# Patient Record
Sex: Male | Born: 1960
Health system: Southern US, Community
[De-identification: ages and names within clinical notes are randomized; demographics above are authoritative.]

## PROBLEM LIST (undated history)

## (undated) DIAGNOSIS — Q453 Other congenital malformations of pancreas and pancreatic duct: Secondary | ICD-10-CM

## (undated) DIAGNOSIS — K219 Gastro-esophageal reflux disease without esophagitis: Secondary | ICD-10-CM

## (undated) DIAGNOSIS — E785 Hyperlipidemia, unspecified: Secondary | ICD-10-CM

## (undated) DIAGNOSIS — F1011 Alcohol abuse, in remission: Secondary | ICD-10-CM

## (undated) DIAGNOSIS — I2510 Atherosclerotic heart disease of native coronary artery without angina pectoris: Secondary | ICD-10-CM

## (undated) DIAGNOSIS — I10 Essential (primary) hypertension: Secondary | ICD-10-CM

## (undated) DIAGNOSIS — Z8616 Personal history of COVID-19: Secondary | ICD-10-CM

## (undated) DIAGNOSIS — K7689 Other specified diseases of liver: Secondary | ICD-10-CM

## (undated) HISTORY — PX: OTHER SURGICAL HISTORY: SHX169

## (undated) HISTORY — DX: Other congenital malformations of pancreas and pancreatic duct: Q45.3

## (undated) HISTORY — PX: SHOULDER ARTHROSCOPY: SHX128

## (undated) HISTORY — DX: Other specified diseases of liver: K76.89

## (undated) HISTORY — DX: Hyperlipidemia, unspecified: E78.5

## (undated) HISTORY — DX: Alcohol abuse, in remission: F10.11

## (undated) HISTORY — DX: Personal history of COVID-19: Z86.16

## (undated) HISTORY — PX: NASAL SINUS SURGERY: SHX719

## (undated) HISTORY — PX: INGUINAL HERNIA REPAIR: SUR1180

---

## 2006-07-20 ENCOUNTER — Encounter: Admission: RE | Admit: 2006-07-20 | Discharge: 2006-07-20 | Payer: Self-pay | Admitting: Gastroenterology

## 2006-07-21 ENCOUNTER — Ambulatory Visit (HOSPITAL_COMMUNITY): Admission: RE | Admit: 2006-07-21 | Discharge: 2006-07-21 | Payer: Self-pay | Admitting: Gastroenterology

## 2006-07-28 ENCOUNTER — Encounter: Admission: RE | Admit: 2006-07-28 | Discharge: 2006-07-28 | Payer: Self-pay | Admitting: Interventional Radiology

## 2006-09-25 ENCOUNTER — Encounter: Admission: RE | Admit: 2006-09-25 | Discharge: 2006-09-25 | Payer: Self-pay | Admitting: General Surgery

## 2006-10-02 HISTORY — PX: COLECTOMY: SHX59

## 2006-10-30 ENCOUNTER — Encounter (INDEPENDENT_AMBULATORY_CARE_PROVIDER_SITE_OTHER): Payer: Self-pay | Admitting: General Surgery

## 2006-10-30 ENCOUNTER — Inpatient Hospital Stay (HOSPITAL_COMMUNITY): Admission: RE | Admit: 2006-10-30 | Discharge: 2006-11-04 | Payer: Self-pay | Admitting: General Surgery

## 2007-06-02 ENCOUNTER — Encounter: Admission: RE | Admit: 2007-06-02 | Discharge: 2007-06-02 | Payer: Self-pay | Admitting: Gastroenterology

## 2007-06-02 IMAGING — CT CT ABDOMEN W/ CM
2 of 5 series · 16 of 46 positions shown, 18 images · IV contrast (READICAT/WATER & [ID] OMNI 300)
Comparison: Barium enema [DATE] and CT abdomen pelvis [DATE]

CT ABDOMEN

CLINICAL DATA: Sigmoid colectomy with pain.

CT ABDOMEN AND PELVIS WITH CONTRAST
TECHNIQUE: Multidetector CT imaging of the abdomen and pelvis was
performed using the standard protocol following bolus
administration of intravenous contrast.
Contrast: 125 ml [16]

[Series 3: routine abdomen · axial · 0.78mm/px · z∈[-477,-82]mm · 13 of 89 slices shown, 15 images]
[im 5/89  soft-tissue]
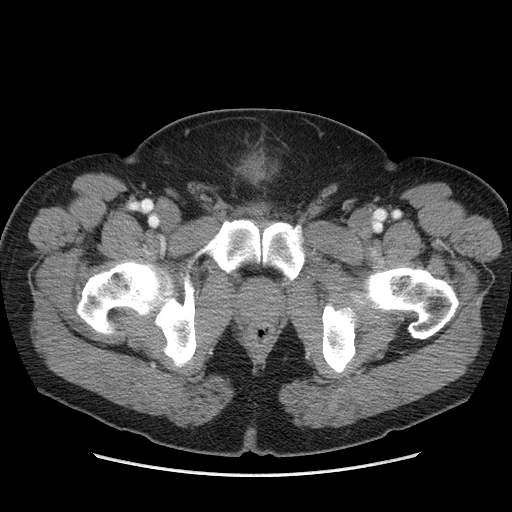
[im 5/89  bone]
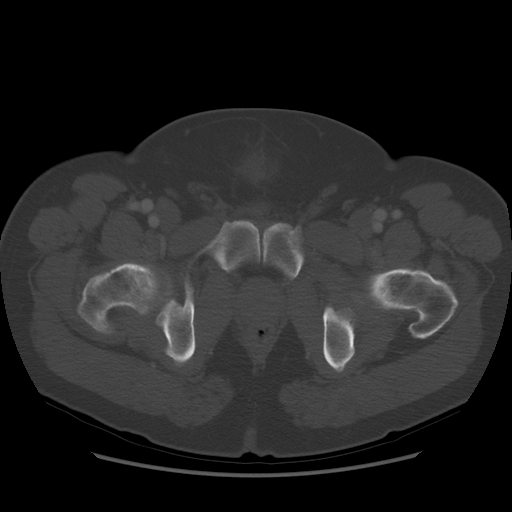
[im 10/89  soft-tissue]
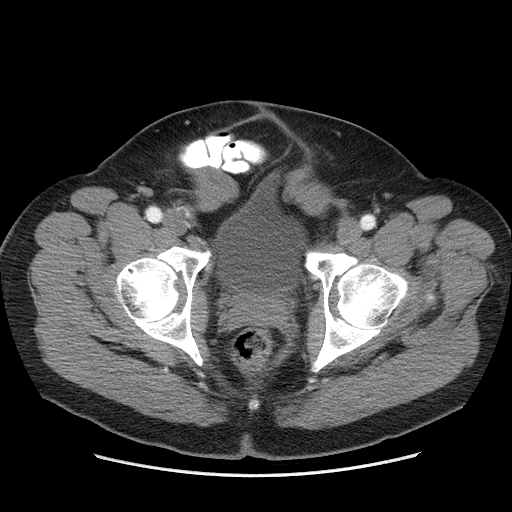
[im 20/89  soft-tissue]
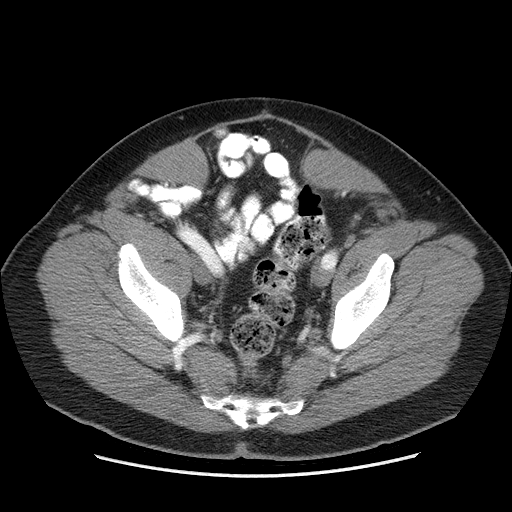
[im 25/89  soft-tissue]
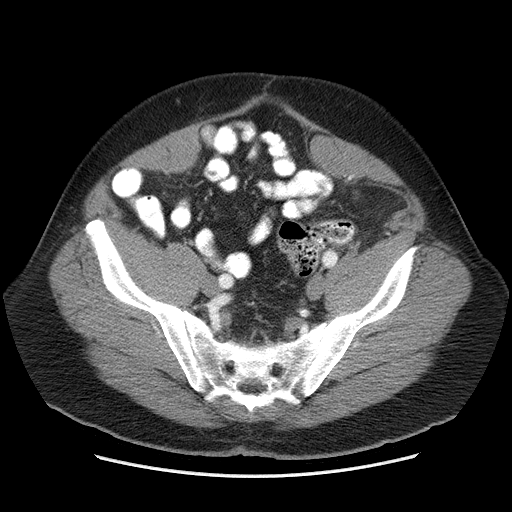
[im 30/89  soft-tissue]
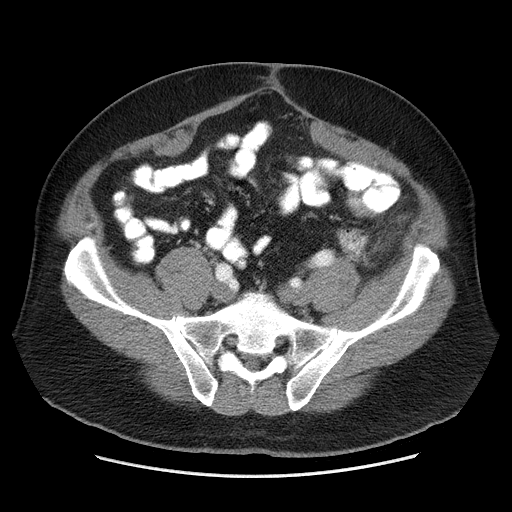
[im 40/89  soft-tissue]
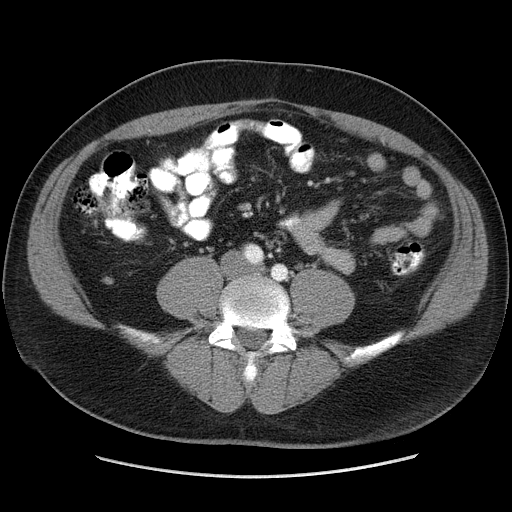
[im 45/89  soft-tissue]
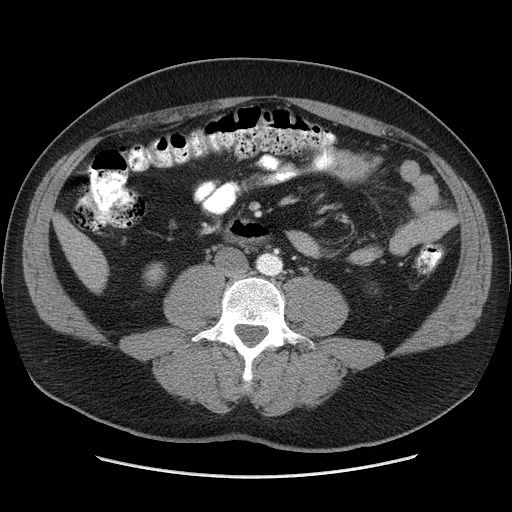
[im 49/89  soft-tissue]
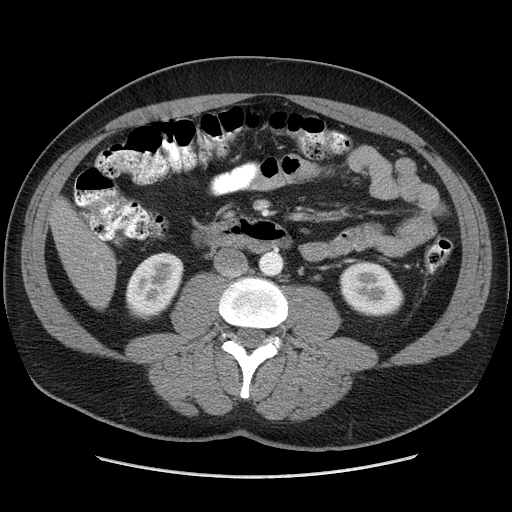
[im 59/89  soft-tissue]
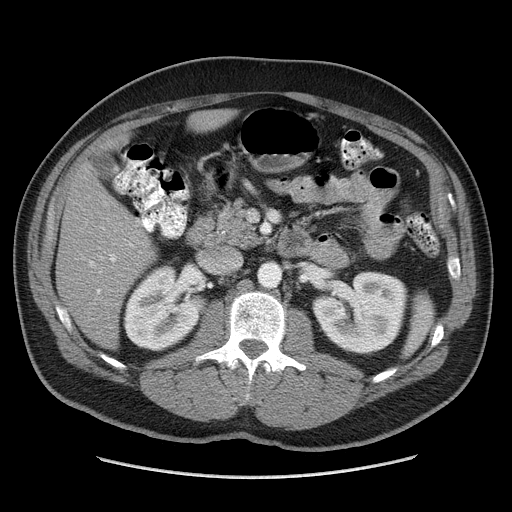
[im 59/89  bone]
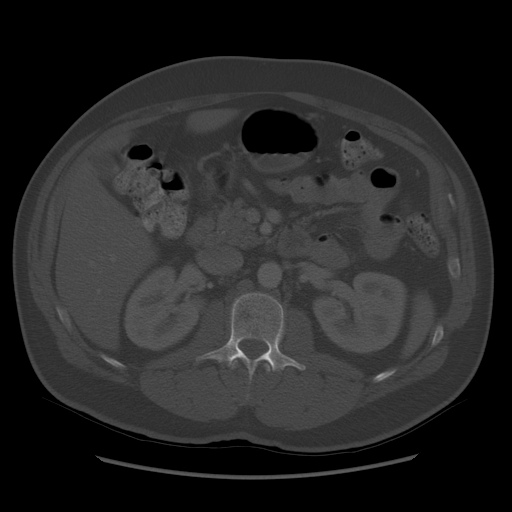
[im 64/89  soft-tissue]
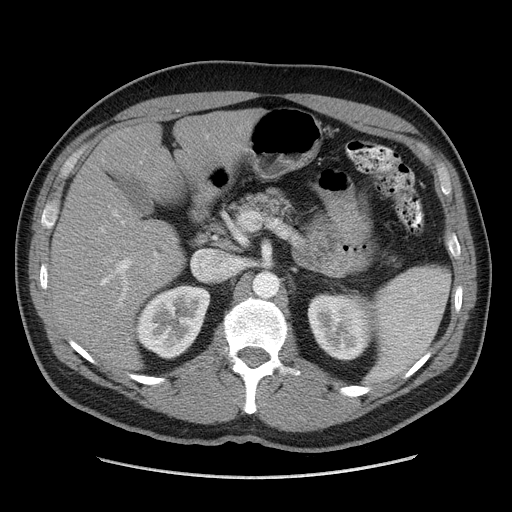
[im 69/89  soft-tissue]
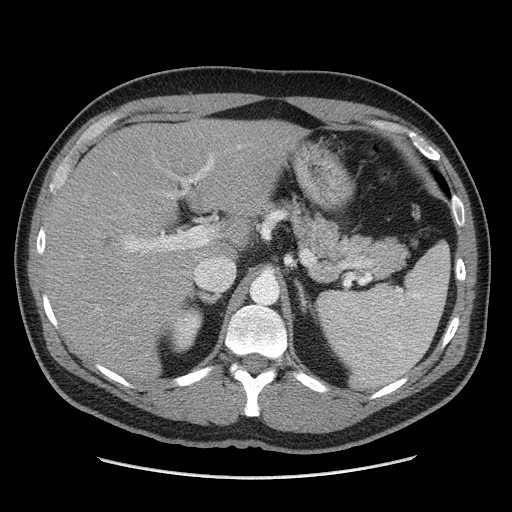
[im 79/89  soft-tissue]
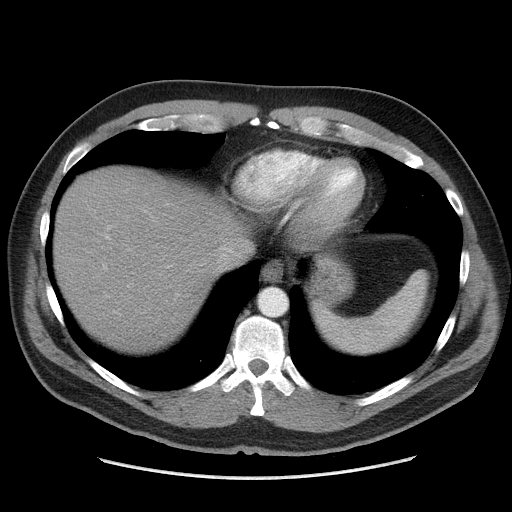
[im 84/89  soft-tissue]
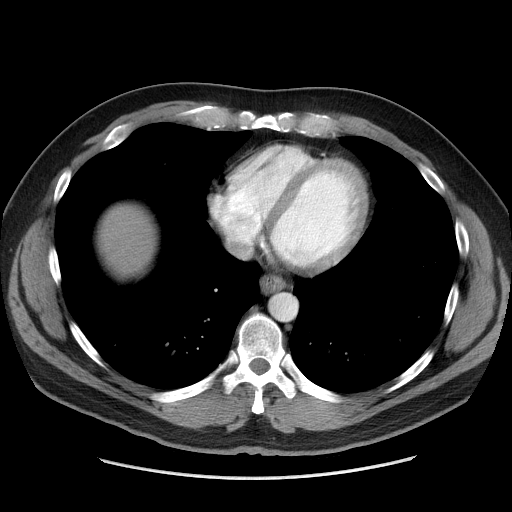

[Series 602: sagittal body · sagittal · 0.91mm/px · 3 of 161 slices shown]
[im 54/161  soft-tissue]
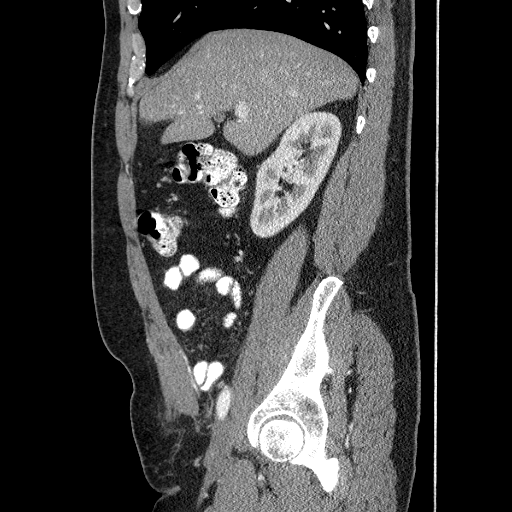
[im 72/161  soft-tissue]
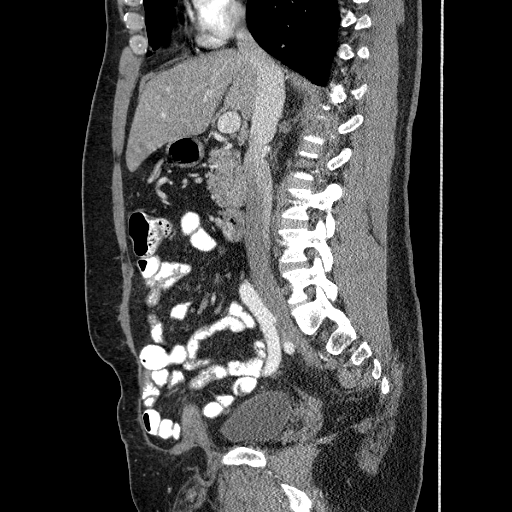
[im 89/161  soft-tissue]
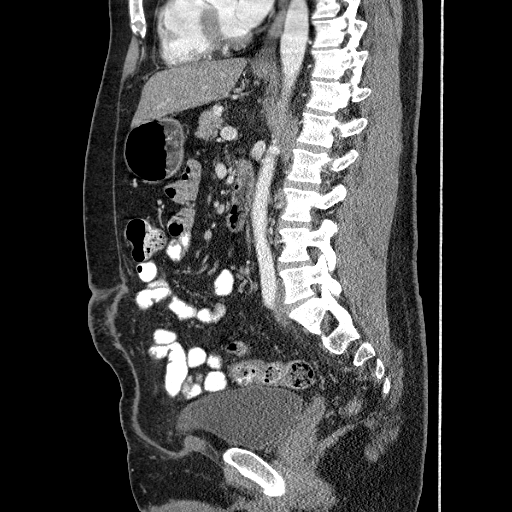

[16 of 46 positions shown; findings below may reference images not displayed]

FINDINGS: The visualized lung bases are clear.

There is mild diffuse fatty infiltration of the liver.  A
circumscribed 1 cm low density lesion in the caudate lobe is
stable, most likely a simple cyst.  Likewise, two 6 mm low density
lesions in the right lobe the liver are stable, and most likely
cysts.

The spleen, adrenal glands, pancreas, gallbladder, and kidneys
within normal limits.  On delayed imaging, both kidneys secrete
contrast symmetrically, without evidence of obstruction.  The
abdominal aorta is normal in caliber.

Bowel loops are normal in caliber.  Negative for lymphadenopathy.

In the left lower quadrant, there is stranding in the mesentery
adjacent to a short segment of the descending colon, that likely
represents inflammatory changes of acute diverticulitis.  These
changes are best seen on images numbers 55 through 61 of series 3.
There is no evidence of colonic wall thickening or obstruction.  No
abscess or free air is identified.
IMPRESSION: 1.  Stranding in the mesentery adjacent to the distal descending
colon as described above most likely represents acute
diverticulitis. No complicating features are identified.

2. Fatty liver.

3. Three stable low density lesions in the liver are most likely
cysts.

CT PELVIS
FINDINGS: There are postoperative changes of sigmoid partial
sigmoid colectomy, with an anastomotic suture identified.  There is
no evidence of bowel obstruction.  There is an infraumbilical lower
anterior wall abdominal hernia, which is new.  The rectus muscles
are separated and this hernia has a wide-mouth, measuring
approximately 7 cm transverse diameter.  Several small bowel loops
protrude into this hernia but do not appear obstructed or inflamed.
A small portion of the anterior bladder also extends into this
hernia.  Otherwise, the urinary bladder is unremarkable.  No free
fluid or abscess is identified in the pelvis. The appendix is
normal.

No acute osseous abnormalities identified.
IMPRESSION: 1.  Interval development of lower anterior abdominal wall ventral
hernia, which contains nonobstructed small bowel loops and a small
portion of the anterior aspect of the urinary bladder.

## 2007-08-02 HISTORY — PX: HERNIA REPAIR: SHX51

## 2007-08-06 ENCOUNTER — Ambulatory Visit (HOSPITAL_COMMUNITY): Admission: RE | Admit: 2007-08-06 | Discharge: 2007-08-06 | Payer: Self-pay | Admitting: General Surgery

## 2007-08-31 ENCOUNTER — Inpatient Hospital Stay (HOSPITAL_COMMUNITY): Admission: RE | Admit: 2007-08-31 | Discharge: 2007-09-02 | Payer: Self-pay | Admitting: General Surgery

## 2010-07-16 NOTE — Op Note (Signed)
Darrell Coleman, Darrell Coleman                ACCOUNT NO.:  [id number]   MEDICAL RECORD NO.:  [id number]          PATIENT TYPE:  OIB   LOCATION:  5125                         FACILITY:  [facility]   PHYSICIAN:  Adolph Pollack, M.D.DATE OF BIRTH:  [birth year]   DATE OF PROCEDURE:  DATE OF DISCHARGE:                               OPERATIVE REPORT   PREOPERATIVE DIAGNOSIS:  Ventral incisional hernia.   POSTOPERATIVE DIAGNOSIS:  Ventral incisional hernia.   PROCEDURE:  Laparoscopic repair of ventral incisional hernia with  Parietex composite mesh (20 x 30 cm).   SURGEON:  Adolph Pollack, MD.   ASSISTANTAngelia Mould. Derrell Lolling, MD.   ANESTHESIA:  General.   INDICATIONS:  This is a 47-year male with a laparoscopic-assisted  sigmoid colectomy in late 2008.  He subsequently developed a ventral  incisional hernia and now presents for repair.   TECHNIQUE:  He was seen in the holding area and then brought to the  operating room, placed on the operating table and general anesthetic was  administered.  A Foley catheter was placed in the bladder.  The hair on  the abdominal wall was shaved and the abdominal wall groin area were  sterilely prepped and draped.  A small incision was made in the right  upper quadrant and using a 5 mm Optiview trocar, I was able to gain  access to the peritoneal cavity.  I carefully examined the area beneath  the trocar and no injury to the viscera was noted.  CO2 gas was  insufflated creating a pneumoperitoneum.  I visualized the hernia defect  noted some omentum up into the hernia.  I subsequently placed a 5 mm  trocar in the right mid lateral abdomen and one in the left upper  quadrant and one in the left mid and lateral abdomen.  Using Harmonic  Scalpel, I dissected the omentum free from the hernia, exposing that  area.  In order for it to be indicated an inferior fixation, I felt I  was going to need to expose the symphysis pubis and Cooper ligament.  Thus, I began  creating a peritoneal flap close to muscle by dividing the  peritoneum and mobilizing it posteriorly.  I subsequently was mobilizing  the bladder more posteriorly as well.  I then was able to expose the  symphysis pubis and Cooper ligament bilaterally.   Following this mobilization, I felt the bladder up of 350 mL of blue  solution and noticed no leaks.  I then decompressed the bladder.   I then used a spinal needle to identify the rim of the hernia and then  measured 4 cm back from this.  Following this, a piece of 20 cm x 30 cm  Parietex composite mesh was brought into the field with a nonadherent  barrier on it.  I placed a 7 anchoring sutures of #1  Novafil on the  mesh and then hydrated it.  It was then placed into abdominal cavity and  positioned, so that the nonadherent barrier side was facing the viscera.  I then made 7 stab incisions, beginning at 12  o'clock and then going at  approximately 1:30, 3 o'clock, 5:30, 7:30, 10 o'clock.  I then threaded  the anchoring sutures up across the fascial bridge and tied these down,  initially anchoring the mesh to the abdominal wall with good overlap  covering the defect.  Then, the inferior portion of the mesh, I  approached and I laid against Cooper ligament and tacked it with  multiple tacks to Why ligament so that there were no gaps.  Following  this, I then packed the rest of the mesh in the periphery with both an  outer ring and an inner ring using spiral tacks that allowed for good  fixation.   I inspected the area, inspected all quadrants, noted no bleeding, and no  visceral injury.  The mesh was in good position with good overlap.   In order to place the mesh, I had to replace the right upper quadrant 5  mm trocar with 12 mm trocar.  Thus I removed this trocar and then closed  the fascial defect with a 0 Vicryl suture.  I then removed the remaining  trocars and released pneumoperitoneum.   All skin incisions were then closed  with 4-0 Monocryl subcuticular  stitches followed by Steri-Strips, Sterile dressings.  He tolerated  procedure without apparent complications and was taken to recovery in  satisfactory condition.      Adolph Pollack, M.D.  Electronically Signed     TJR/MEDQ  D:  08/31/2007  T:  09/01/2007  Job:  [id number]   cc:   Danise Edge, M.D.  Vikki Ports, M.D.

## 2010-07-16 NOTE — Op Note (Signed)
NAMEMERRIL, NAGY                ACCOUNT NO.:  [id number]   MEDICAL RECORD NO.:  [id number]          PATIENT TYPE:  INP   LOCATION:  1536                         FACILITY:  [facility]   PHYSICIAN:  Adolph Pollack, M.D.DATE OF BIRTH:  [birth year]   DATE OF PROCEDURE:  10/30/2006  DATE OF DISCHARGE:                               OPERATIVE REPORT   PREOPERATIVE DIAGNOSIS:  Recurrent sigmoid diverticulitis.   POSTOPERATIVE DIAGNOSIS:  Recurrent sigmoid diverticulitis.   PROCEDURE:  Laparoscopic assisted sigmoid colectomy with mobilization of  splenic flexure.   SURGEON:  Adolph Pollack, M.D.   ASSISTANT:  Leonie Man, M.D.   ANESTHESIA:  General.   INDICATIONS:  This is a 50 year old male who has had two separate  episodes of diverticulitis.  The last one in May was complicated by an  abscess that required percutaneous drainage.  A barium enema  demonstrates a sigmoid diverticulosis but no other lesions.  He now  presents for elective sigmoid colectomy.   TECHNIQUE:  He is brought to the operating room, placed supine on the  operating table, and a general anesthetic was administered.  A Foley  catheter was placed in the bladder and he was placed in the lithotomy  position.  The hair of the abdominal wall was clipped.  The abdominal  wall and perineal area were sterilely prepped and draped.  A transverse  incision was made superior to the umbilicus through the skin and  subcutaneous tissue, then in the fascia and peritoneum entering the  peritoneal cavity.  A pursestring suture of 0 Vicryl was placed around  the fascial edges.  A Hassan trocar was introduced into the peritoneal  cavity and pneumoperitoneum created by insufflation of CO2 gas.   A laparoscope was then introduced.  An 11 mm trocar was placed in the  right lower quadrant, a 5 mm trocar placed in the suprapubic region, and  a 5 mm trocar placed in the left lower quadrant.  By introducing the  laparoscope, I  could see inflammatory changes and a track from the  catheter if fiber tissue at the anterior abdominal wall which I took all  this down sharply.  I then divided the lateral attachments to the  sigmoid and descending colon heading up toward the splenic flexure.  The  Omega maneuver was performed and I mobilized the splenic flexure using  select electrocautery and sharp dissection.  I then mobilized the  rectosigmoid junction bilaterally.  The left ureter was identified and  the plane of dissection kept anterior to it.  This allowed fairly good  mobilization.  At this point, I removed the suprapubic trocar and made a  longitudinal lower midline incision for an extraction site.  He had a  very thick abdominal wall and I had to extend the excision up few cm  inferior to the umbilicus.  A Balfour retractor was used.  I further  mobilized the rectal area.  I then noticed the descending colon which  was normal as well as the rectosigmoid junction.  I divided the  descending colon with the stapler.  The mesentery  of the sigmoid colon  was divided with the LigaSure close to the sigmoid colon down to the  rectosigmoid junction.  This was also then divided with the linear  cutting stapler.  The specimen's distal margin was marked with a suture  and it was sent to pathology.   Following this, I further mobilized some of the rectum and the  descending colon.  I then partially removed the staple line and  descending colon and inserted a #29 EEA anvil then resealed the staple  line with a linear cutting stapler.  I brought the anvil tip out of the  tinea in a Baker type fashion.  The handle of a #29 EEA was then passed  up through the rectum and into the rectosigmoid junction.  An end-to-  side Baker anastomosis was performed with the circular stapler.  The  stapler was removed and two intact donuts were noted.  I occluded the  descending colon proximal to the anastomosis and placed the anastomosis   under water.  Air was insufflated and no leak was noted.  I reinforced  some of the lateral staple line with stitches.  There was no significant  tension on the anastomosis and it was patent and viable.   At this point, we copiously irrigated out the abdominal cavity and noted  no active bleeding.  I requested a sponge and instrument count and this  was reported to be correct.  I then evacuated as much irrigation fluid  as possible.  I closed the fascia of the lower midline extraction  incision with a #1 running PDS suture.  The subcutaneous tissue was  irrigated.  I reinsufflated the abdomen and reintroduced the  laparoscope.  A little bit of irrigation fluid remains which I  evacuated.  The fascial closure was solid.  There was no active  bleeding.  The trocars were removed and pneumoperitoneum was released.  All skin incisions were then closed with staples and sterile dressings  were applied.  He tolerated the procedure well without any apparent  complications and was taken to the recovery room in satisfactory  condition.      Adolph Pollack, M.D.  Electronically Signed     TJR/MEDQ  D:  10/30/2006  T:  10/31/2006  Job:  [id number]   cc:   Danise Edge, M.D.  Fax: [phone]   Vikki Ports, M.D.  Fax: [phone]

## 2010-07-16 NOTE — H&P (Signed)
NAMEASAF, ELMQUIST                ACCOUNT NO.:  [id number]   MEDICAL RECORD NO.:  [id number]          PATIENT TYPE:  OIB   LOCATION:  5125                         FACILITY:  [facility]   PHYSICIAN:  Adolph Pollack, M.D.DATE OF BIRTH:  1960/03/26   DATE OF ADMISSION:  08/31/2007  DATE OF DISCHARGE:                              HISTORY & PHYSICAL   REASON FOR ADMISSION:  Elective ventral incisional hernia repair.   HISTORY OF PRESENT ILLNESS:  Mr. Pianka is a 50 year old male who  underwent a laparoscopic-assisted sigmoid colectomy late summer of 2008.  He had a significant vomiting, retching episode in December.  I saw him  in December.  I did note he had some asymmetry to his incision, but I  could demonstrate clinically an obvious hernia, but I told him there was  a small chance that could be and if it became more evident, to come  back.  He had little bit of discomfort in left lower quadrant area and a  CT scan was performed in early April which demonstrated a lower  abdominal wall hernia.  He has noticed it increasingly now and was seen  in the office and now presents for repair.  His first operation was  postponed because he developed a rash which ends up to being shingles,  but this has cleared up and his medical doctor has cleared him with  respect to the rash.   PAST MEDICAL HISTORY:  1. Sigmoid diverticulitis.  2. Hypertension.  3. Nasal polyps.  4. Shingles.  5. Femur fracture.   PREVIOUS OPERATIONS:  1. Laparoscopic-assisted sigmoid colectomy.  2. Shoulder arthroscopy.  3. Nasal polypectomy.  4. Bilateral right inguinal hernia repair.  5. Left hydrocelectomy.   ALLERGIES:  PENICILLIN.   CURRENT MEDICATIONS:  Lisinopril, lysine supplement, fish oil, Tylenol.   SOCIAL HISTORY:  He is married.  There is no tobacco use.  Occasional  alcohol use.   PHYSICAL EXAMINATION:  GENERAL:  A mildly overweight male in no acute  distress, pleasant and cooperative.  VITAL  SIGNS: Temperature is 97 degrees, blood pressure is 101/62, pulse  66.  RESPIRATORY:  Breath sounds equal and clear.  Respirations unlabored.  CARDIOVASCULAR:  Regular rate, regular rhythm.  No murmur.  ABDOMEN:  Soft.  There is a lower midline scar, smaller upper midline  scars.  There is a bulge going down to the pubic bone that is reducible.  MUSCULOSKELETAL:  Good range of motion, muscle tone.  No swelling.   IMPRESSION:  Ventral incisional hernia.   PLAN:  Laparoscopic possible open repair with mesh.  We discussed the  procedure and risks preop.      Adolph Pollack, M.D.  Electronically Signed     TJR/MEDQ  D:  08/31/2007  T:  09/01/2007  Job:  [id number]

## 2010-07-19 NOTE — Discharge Summary (Signed)
Darrell Coleman, Darrell Coleman                ACCOUNT NO.:  [id number]   MEDICAL RECORD NO.:  [id number]          PATIENT TYPE:  INP   LOCATION:  1536                         FACILITY:  [facility]   PHYSICIAN:  Adolph Pollack, M.D.DATE OF BIRTH:  04-Apr-1960   DATE OF ADMISSION:  10/30/2006  DATE OF DISCHARGE:  11/04/2006                               DISCHARGE SUMMARY   FINAL DIAGNOSIS:  Recurrent sigmoid diverticulitis.   SECONDARY DIAGNOSIS:  Hypertension.   OPERATION/PROCEDURE:  1. Laparoscopic-assisted sigmoid colectomy.  2. Mobilization of splenic flexure, October 30, 2006.   REASON FOR ADMISSION:  This is a 50 year old male who has had recurring  episodes of sigmoid diverticulitis.  One required percutaneous drainage  of a diverticular abscess.  He is now admitted for elective sigmoid  colectomy.   HOSPITAL COURSE:  He underwent the above procedure and tolerated it  well.  He had basically an uncomplicated postoperative course and had  return of bowel function, was started on diet which was advanced.  By  his fifth postoperative day, he was tolerating full liquids and then he  tolerated a soft diet, bowels were working.  He was passing gas and had  small BMs and he was able to be discharged.   DISPOSITION:  Discharged to home on November 04, 2003.  The staples were  removed and Steri-Strips were applied very close together.  He was given  discharge instructions and Tylox for pain and told to continue his home  medications.  He will follow up with me in the office in approximately 2  to 3 weeks.      Adolph Pollack, M.D.  Electronically Signed     TJR/MEDQ  D:  11/17/2006  T:  11/17/2006  Job:  [id number]   cc:   Danise Edge, M.D.  Fax: [phone]   Vikki Ports, M.D.  Fax: [phone]

## 2010-11-28 LAB — DIFFERENTIAL
Basophils Relative: 1
Basophils Relative: 1
Eosinophils Absolute: 0.8 — ABNORMAL HIGH
Eosinophils Relative: 15 — ABNORMAL HIGH
Lymphocytes Relative: 35
Lymphs Abs: 1.3
Monocytes Absolute: 0.6
Monocytes Absolute: 0.6
Monocytes Relative: 10
Monocytes Relative: 10
Neutro Abs: 2.7
Neutrophils Relative %: 51
Neutrophils Relative %: 45

## 2010-11-28 LAB — CBC
HCT: 40.5
Hemoglobin: 15.4
Hemoglobin: 14.4
MCHC: 35.6
MCV: 100.3 — ABNORMAL HIGH
Platelets: 204
RBC: 4.47
RDW: 12.2
RDW: 13.8

## 2010-11-28 LAB — COMPREHENSIVE METABOLIC PANEL
ALT: 49
AST: 29
Albumin: 4.6
Alkaline Phosphatase: 36 — ABNORMAL LOW
Alkaline Phosphatase: 33 — ABNORMAL LOW
BUN: 8
Calcium: 10
Calcium: 9.6
Creatinine, Ser: 1.06
GFR calc Af Amer: >60
Glucose, Bld: 112 — ABNORMAL HIGH
Glucose, Bld: 92
Potassium: 3.7
Sodium: 139
Total Protein: 7.1
Total Protein: 7.5

## 2010-11-28 LAB — TYPE AND SCREEN
ABO/RH(D): B POS
Antibody Screen: NEGATIVE

## 2010-11-28 LAB — PROTIME-INR
INR: 1
INR: 1

## 2010-11-28 LAB — ABO/RH: ABO/RH(D): B POS

## 2010-12-13 LAB — CBC
HCT: 40.8
MCHC: 35.9
MCV: 98
MCV: 98.6
Platelets: 148 — ABNORMAL LOW
Platelets: 185
RBC: 3.36 — ABNORMAL LOW
RDW: 12.9
WBC: 5.4
WBC: 6.7

## 2010-12-13 LAB — COMPREHENSIVE METABOLIC PANEL
AST: 30
Albumin: 3.7
BUN: 7
Creatinine, Ser: 0.9
GFR calc Af Amer: >60
Total Protein: 7.1

## 2010-12-13 LAB — BASIC METABOLIC PANEL
BUN: 4 — ABNORMAL LOW
CO2: 25
Calcium: 8.3 — ABNORMAL LOW
Chloride: 105
Creatinine, Ser: 1
GFR calc Af Amer: >60

## 2010-12-13 LAB — DIFFERENTIAL
Eosinophils Relative: 11 — ABNORMAL HIGH
Lymphocytes Relative: 23
Lymphs Abs: 1.2
Monocytes Absolute: 0.5
Monocytes Relative: 9
Neutro Abs: 3

## 2010-12-13 LAB — TYPE AND SCREEN
ABO/RH(D): B POS
Antibody Screen: NEGATIVE

## 2010-12-13 LAB — ABO/RH: ABO/RH(D): B POS

## 2011-06-02 ENCOUNTER — Inpatient Hospital Stay (HOSPITAL_COMMUNITY)
Admission: EM | Admit: 2011-06-02 | Discharge: 2011-06-03 | DRG: 125 | Disposition: A | Discharge status: Home / Self Care | Payer: BC Managed Care – PPO | Source: Ambulatory Visit | Attending: Cardiology | Admitting: Cardiology

## 2011-06-02 ENCOUNTER — Emergency Department (HOSPITAL_COMMUNITY): Payer: BC Managed Care – PPO

## 2011-06-02 ENCOUNTER — Other Ambulatory Visit: Payer: Self-pay

## 2011-06-02 ENCOUNTER — Encounter (HOSPITAL_COMMUNITY): Payer: Self-pay | Admitting: *Deleted

## 2011-06-02 ENCOUNTER — Encounter (HOSPITAL_COMMUNITY)
Admission: EM | Disposition: A | Discharge status: Home / Self Care | Payer: Self-pay | Source: Ambulatory Visit | Attending: Cardiology

## 2011-06-02 DIAGNOSIS — R0789 Other chest pain: Principal | ICD-10-CM | POA: Diagnosis present

## 2011-06-02 DIAGNOSIS — R072 Precordial pain: Secondary | ICD-10-CM

## 2011-06-02 DIAGNOSIS — Z880 Allergy status to penicillin: Secondary | ICD-10-CM

## 2011-06-02 DIAGNOSIS — R079 Chest pain, unspecified: Secondary | ICD-10-CM

## 2011-06-02 DIAGNOSIS — I10 Essential (primary) hypertension: Secondary | ICD-10-CM | POA: Diagnosis present

## 2011-06-02 DIAGNOSIS — Z79899 Other long term (current) drug therapy: Secondary | ICD-10-CM

## 2011-06-02 DIAGNOSIS — I2510 Atherosclerotic heart disease of native coronary artery without angina pectoris: Secondary | ICD-10-CM

## 2011-06-02 DIAGNOSIS — Z7982 Long term (current) use of aspirin: Secondary | ICD-10-CM

## 2011-06-02 DIAGNOSIS — E785 Hyperlipidemia, unspecified: Secondary | ICD-10-CM

## 2011-06-02 DIAGNOSIS — Z87891 Personal history of nicotine dependence: Secondary | ICD-10-CM

## 2011-06-02 HISTORY — PX: LEFT HEART CATHETERIZATION WITH CORONARY ANGIOGRAM: SHX5451

## 2011-06-02 HISTORY — DX: Gastro-esophageal reflux disease without esophagitis: K21.9

## 2011-06-02 HISTORY — DX: Essential (primary) hypertension: I10

## 2011-06-02 HISTORY — DX: Atherosclerotic heart disease of native coronary artery without angina pectoris: I25.10

## 2011-06-02 HISTORY — PX: CARDIAC CATHETERIZATION: SHX172

## 2011-06-02 LAB — DIFFERENTIAL
Basophils Absolute: 0 10*3/uL (ref 0.0–0.1)
Lymphocytes Relative: 20 % (ref 12–46)
Lymphs Abs: 1.2 10*3/uL (ref 0.7–4.0)
Neutro Abs: 3.3 10*3/uL (ref 1.7–7.7)

## 2011-06-02 LAB — BASIC METABOLIC PANEL
CO2: 26 mEq/L (ref 19–32)
Chloride: 102 mEq/L (ref 96–112)
Glucose, Bld: 113 mg/dL — ABNORMAL HIGH (ref 70–99)
Potassium: 3.5 mEq/L (ref 3.5–5.1)
Sodium: 137 mEq/L (ref 135–145)

## 2011-06-02 LAB — CARDIAC PANEL(CRET KIN+CKTOT+MB+TROPI)
CK, MB: 1.2 ng/mL (ref 0.3–4.0)
CK, MB: 1.2 ng/mL (ref 0.3–4.0)
Relative Index: INVALID (ref 0.0–2.5)
Total CK: 64 U/L (ref 7–232)
Troponin I: <0.3 ng/mL (ref <0.30)

## 2011-06-02 LAB — CBC
HCT: 36.5 % — ABNORMAL LOW (ref 39.0–52.0)
Hemoglobin: 13 g/dL (ref 13.0–17.0)
MCV: 91 fL (ref 78.0–100.0)
Platelets: 208 10*3/uL (ref 150–400)
RBC: 4.33 MIL/uL (ref 4.22–5.81)
RBC: 3.99 MIL/uL — ABNORMAL LOW (ref 4.22–5.81)
RDW: 12.5 % (ref 11.5–15.5)
RDW: 12.7 % (ref 11.5–15.5)
WBC: 5.9 10*3/uL (ref 4.0–10.5)
WBC: 6 10*3/uL (ref 4.0–10.5)

## 2011-06-02 LAB — CREATININE, SERUM
GFR calc Af Amer: >90 mL/min (ref >90)
GFR calc non Af Amer: >90 mL/min (ref >90)

## 2011-06-02 LAB — TSH: TSH: 2.27 u[IU]/mL (ref 0.350–4.500)

## 2011-06-02 LAB — D-DIMER, QUANTITATIVE: D-Dimer, Quant: 0.32 ug/mL-FEU (ref 0.00–0.48)

## 2011-06-02 LAB — TROPONIN I: Troponin I: <0.3 ng/mL (ref <0.30)

## 2011-06-02 SURGERY — LEFT HEART CATHETERIZATION WITH CORONARY ANGIOGRAM
Anesthesia: LOCAL

## 2011-06-02 MED ORDER — ASPIRIN 81 MG PO CHEW
324.0000 mg | CHEWABLE_TABLET | Freq: Once | ORAL | Status: AC
  Administered 2011-06-02: 324 mg via ORAL
  Filled 2011-06-02: qty 4

## 2011-06-02 MED ORDER — NITROGLYCERIN 0.4 MG SL SUBL
0.4000 mg | SUBLINGUAL_TABLET | SUBLINGUAL | Status: DC | PRN
  Administered 2011-06-02: 0.4 mg via SUBLINGUAL
  Filled 2011-06-02: qty 25

## 2011-06-02 MED ORDER — SODIUM CHLORIDE 0.9 % IV SOLN: INTRAVENOUS | Status: AC

## 2011-06-02 MED ORDER — NITROGLYCERIN 0.4 MG SL SUBL: 0.4000 mg | SUBLINGUAL_TABLET | SUBLINGUAL | Status: DC | PRN

## 2011-06-02 MED ORDER — PANTOPRAZOLE SODIUM 40 MG PO TBEC
40.0000 mg | DELAYED_RELEASE_TABLET | Freq: Every day | ORAL | Status: DC
  Administered 2011-06-02: 40 mg via ORAL
  Filled 2011-06-02: qty 1

## 2011-06-02 MED ORDER — ZOLPIDEM TARTRATE 5 MG PO TABS: 5.0000 mg | ORAL_TABLET | Freq: Every evening | ORAL | Status: DC | PRN

## 2011-06-02 MED ORDER — SODIUM CHLORIDE 0.9 % IV SOLN: INTRAVENOUS | Status: DC

## 2011-06-02 MED ORDER — METOPROLOL TARTRATE 12.5 MG HALF TABLET
12.5000 mg | ORAL_TABLET | Freq: Two times a day (BID) | ORAL | Status: DC
  Administered 2011-06-02 – 2011-06-03 (×2): 12.5 mg via ORAL
  Filled 2011-06-02 (×3): qty 1

## 2011-06-02 MED ORDER — MORPHINE SULFATE 2 MG/ML IJ SOLN: 2.0000 mg | INTRAMUSCULAR | Status: DC | PRN

## 2011-06-02 MED ORDER — ONDANSETRON HCL 4 MG/2ML IJ SOLN: 4.0000 mg | Freq: Four times a day (QID) | INTRAMUSCULAR | Status: DC | PRN

## 2011-06-02 MED ORDER — HEPARIN BOLUS VIA INFUSION: 4000.0000 [IU] | Freq: Once | INTRAVENOUS | Status: DC

## 2011-06-02 MED ORDER — HEPARIN (PORCINE) IN NACL 100-0.45 UNIT/ML-% IJ SOLN
INTRAMUSCULAR | Status: AC
  Filled 2011-06-02: qty 250

## 2011-06-02 MED ORDER — ASPIRIN EC 325 MG PO TBEC
325.0000 mg | DELAYED_RELEASE_TABLET | Freq: Every day | ORAL | Status: DC
  Administered 2011-06-03: 325 mg via ORAL
  Filled 2011-06-02: qty 1

## 2011-06-02 MED ORDER — MORPHINE SULFATE 4 MG/ML IJ SOLN
4.0000 mg | Freq: Once | INTRAMUSCULAR | Status: DC
  Filled 2011-06-02: qty 1

## 2011-06-02 MED ORDER — FEBUXOSTAT 40 MG PO TABS
40.0000 mg | ORAL_TABLET | Freq: Every day | ORAL | Status: DC
  Filled 2011-06-02: qty 1

## 2011-06-02 MED ORDER — ASPIRIN EC 81 MG PO TBEC: 81.0000 mg | DELAYED_RELEASE_TABLET | Freq: Every day | ORAL | Status: DC

## 2011-06-02 MED ORDER — LISINOPRIL 20 MG PO TABS
20.0000 mg | ORAL_TABLET | Freq: Every day | ORAL | Status: DC
  Filled 2011-06-02: qty 1

## 2011-06-02 MED ORDER — SODIUM CHLORIDE 0.9 % IV SOLN: 250.0000 mL | INTRAVENOUS | Status: DC | PRN

## 2011-06-02 MED ORDER — ACETAMINOPHEN 325 MG PO TABS: 650.0000 mg | ORAL_TABLET | ORAL | Status: DC | PRN

## 2011-06-02 MED ORDER — ALPRAZOLAM 0.25 MG PO TABS: 0.2500 mg | ORAL_TABLET | Freq: Two times a day (BID) | ORAL | Status: DC | PRN

## 2011-06-02 MED ORDER — SODIUM CHLORIDE 0.9 % IJ SOLN: 3.0000 mL | INTRAMUSCULAR | Status: DC | PRN

## 2011-06-02 MED ORDER — SODIUM CHLORIDE 0.9 % IJ SOLN
3.0000 mL | Freq: Two times a day (BID) | INTRAMUSCULAR | Status: DC
  Administered 2011-06-02 (×2): 3 mL via INTRAVENOUS

## 2011-06-02 MED ORDER — ATORVASTATIN CALCIUM 40 MG PO TABS
40.0000 mg | ORAL_TABLET | Freq: Every day | ORAL | Status: DC
  Administered 2011-06-02: 40 mg via ORAL
  Filled 2011-06-02 (×3): qty 1

## 2011-06-02 MED ORDER — ENOXAPARIN SODIUM 40 MG/0.4ML ~~LOC~~ SOLN
40.0000 mg | SUBCUTANEOUS | Status: DC
  Administered 2011-06-02: 40 mg via SUBCUTANEOUS
  Filled 2011-06-02 (×2): qty 0.4

## 2011-06-02 NOTE — ED Notes (Signed)
[facility] at bedside. Pt discharged by [facility] to [facility].

## 2011-06-02 NOTE — CV Procedure (Signed)
   Cardiac Catheterization Procedure Note  Name: Darrell Coleman MRN: [id number] DOB: [birth year]  Procedure: Left Heart Cath, Selective Coronary Angiography, LV angiography  Indication: 51 year old white male with history of hypertension and family history of coronary disease presents today with acute chest pain. Initial ECG shows very subtle ST segment changes in the inferior leads. Initial laboratory data was unremarkable. Given his ongoing chest pain it was felt that emergent cardiac catheterization was indicated to assess his coronary anatomy and help guide therapy.   Procedural Details: The right wrist was prepped, draped, and anesthetized with 1% lidocaine. Using the modified Seldinger technique, a 6 French sheath was introduced into the right radial artery. 3 mg of verapamil was administered through the sheath, weight-based unfractionated heparin was administered intravenously. Standard Judkins catheters were used for selective coronary angiography and left ventriculography. Catheter exchanges were performed over an exchange length guidewire. There were no immediate procedural complications. A TR band was used for radial hemostasis at the completion of the procedure.  The patient was transferred to the post catheterization recovery area for further monitoring.  Procedural Findings: Hemodynamics: AO 94/66 with a mean of 81 mmHg LV 89/9 mmHg  Coronary angiography: Coronary dominance: right  Left mainstem: Normal.  Left anterior descending (LAD): The LAD extends to the apex. It gives rise to a single diagonal branch. There is a long segment of 40% narrowing in the mid LAD. This has a smooth appearance. There is 30% eccentric disease in the distal LAD. The first diagonal has an 80% ostial stenosis.  Left circumflex (LCx): The left circumflex coronary gives rise to 2 marginal branches. It has scattered irregularities in the proximal mid vessel up to 20%.  Right coronary artery (RCA): The  right coronary is a large dominant vessel. There is 20-30% disease in the proximal to mid vessel. The distal vessel there are minor irregularities less than 10%.  Left ventriculography: Left ventricular systolic function is normal, LVEF is estimated at 55-65%, there is no significant mitral regurgitation   Final Conclusions:   1. Single vessel atherosclerotic coronary disease. There is a moderate to severe lesion at the ostium of the diagonal. There is a long segment of narrowing in the mid LAD which may represent a myocardial bridge. 2. Normal left ventricular function.  Recommendations: Recommend medical therapy at this point with aspirin, beta blocker, and statin. Patient's chest pain has resolved. We'll check serial cardiac enzymes and ECG. Patient may need a functional study to assess the significance of the LAD and diagonal disease.  [name] [place] 06/02/2011, 9:59 AM

## 2011-06-02 NOTE — Progress Notes (Signed)
Responded to [employer]  page to provided  pastoral services to pt. and support to staff.  Pt. said that his wife was in [place] and he requested that his friends Bosie Clos [phone] and Tim Clontz,V.P. [facility]  [phone])  be called. Contacts were made with pt. friends and they spoke with Pt. nurse and indicated that they would call pt. wife. Contact information for wife - [phone]. I worked with Haematologist, Pt. and pt. friends to provide a spiritual presence and to promote information sharing.  Will follow as needed.   06/02/11 0900  Clinical Encounter Type  Visited With Patient;Other (Comment) (Cath Lab Nurse)  Visit Type Pre-op  Referral From Nurse  Spiritual Encounters  Spiritual Needs Emotional;Other (Comment) (Support to staff)  Stress Factors  Patient Stress Factors None identified

## 2011-06-02 NOTE — ED Notes (Signed)
Pt reports CP and pressure relieved by nitro SL. States feels better now.

## 2011-06-02 NOTE — ED Notes (Signed)
Pt states "this started Saturday, went away and came back yesterday, I kind of have a h/a, I've seen a cardiologist with [facility] but they can't find anything, haven't had any nausea or vomiting"

## 2011-06-02 NOTE — H&P (Signed)
History and Physical  Patient ID: Darrell Coleman Patient ID: Darrell Coleman MRN: [id number], DOB/AGE: June 29, 1960 51 y.o. Date of Encounter: 06/02/2011  Primary Physician: No primary provider on file. Primary Cardiologist: New  Chief Complaint:  Chest pain  HPI: 51 year old male with no previous history of CAD had chest pain that started last pm. His symptoms worsened and he came to [facility] ER this am. His chest pain was ongoing and his ECG was abnormal so code STEMI was called and he was transferred emergently to [facility] for cath.  Pt had chest pain before but always resolved with antacids. Last pm, he had onset of SSCP, 5/10, started at rest, pressure. Associated SOB but not N&V, Diaphoresis. He came to [facility] ER this am and his pain is down to a 3/10. He had never had this pain before.   Past Medical History  Diagnosis Date  . Hypertension      Surgical History:  Past Surgical History  Procedure Date  . Hernia repair   . Colon surgery   . Nasal sinus surgery     polyp removal     I have reviewed the patient's current medications. Prior to Admission: Lisinopril 20 and Uloric 40 mg Scheduled:    Heparin bolus and gtt     . aspirin  324 mg Oral Once   Allergies:  Allergies  Allergen Reactions  . Penicillins     History   Social History  . Marital Status: Married    Spouse Name: N/A    Number of Children: N/A  . Years of Education: N/A   Occupational History  . Not on file.   Social History Main Topics  . Smoking status: Former Games developer  . Smokeless tobacco: Not on file  . Alcohol Use: No  . Drug Use: No  . Sexually Active:    Other Topics Concern  . Not on file   Social History Narrative   He is an Public affairs consultant. He quit smoking > 10 years ago. He had problems with ETOH but quit > 1 year ago. He is married.  Family History: His father died at 75 from an MI but his mother has no cardiac issues.    Review of Systems: He has occasional heartburn but no melena. He  has occ MS aches/pains. He has no recent illnesses, fevers or chills. Full 14-point review of systems otherwise negative except as noted above.  Physical Exam: Blood pressure 142/74, pulse 77, temperature 98.5 F (36.9 C), temperature source Oral, resp. rate 15, weight 220 lb (99.791 kg), SpO2 98.00%. General: Well developed, well nourished, male in no acute distress. Head: Normocephalic, atraumatic, sclera non-icteric, no xanthomas, nares are without discharge. Dentition: good Neck: No carotid bruits. JVD not elevated. No thyromegally Lungs: Good expansion bilaterally, Clear bilaterally to auscultation without wheezes or rhonchi. Rales minimal at the bases Heart: Regular rate and rhythm with S1 S2. No S3 or S4.  No murmurs, no rubs, or gallops appreciated. Abdomen: Soft, non-tender, non-distended with normoactive bowel sounds. No hepatomegaly. No rebound/guarding. No obvious abdominal masses. Msk:  Strength and tone appear normal for age. No joint deformities or effusions  Extremities: No clubbing or cyanosis. No edema.  Distal pedal pulses are 2+ and equal bilaterally. Neuro: Alert and oriented X 3. Moves all extremities spontaneously. No focal deficits noted. Psych:  Responds to questions appropriately with a normal affect. Skin: No rashes or lesions noted  Labs:  Lab Results  Component Value Date  WBC 5.9 06/02/2011   HGB 13.9 06/02/2011   HCT 39.4 06/02/2011   MCV 91.0 06/02/2011   PLT 208 06/02/2011     Lab 06/02/11 0815  NA 137  K 3.5  CL 102  CO2 26  BUN 10  CREATININE 0.91  CALCIUM 9.8  PROT --  BILITOT --  ALKPHOS --  ALT --  AST --  GLUCOSE 113*   D-Dimer, Quant  Date Value Range Status  06/02/2011 0.32  0.00-0.48 (ug/mL-FEU) Final     Radiology/Studies:  Dg Chest 2 View  06/02/2011  *RADIOLOGY REPORT*  Clinical Data: Chest pain, shortness of breath.  CHEST - 2 VIEW  Comparison: 10/26/2006  Findings: Heart and mediastinal contours are within normal limits. No focal  opacities or effusions.  No acute bony abnormality.  IMPRESSION: No active cardiopulmonary disease.  Original Report Authenticated By: Cyndie Chime, M.D.      EKG: SR, ST elevation inferior leads when compared to a previous ECG  ASSESSMENT AND PLAN:  Principal Problem:  *STEMI (ST elevation myocardial infarction) - ccardiac cath with further evaluation and treatment based on results.  Active Problems:  HTN (hypertension) - cont Lisinopril  Unknown lipids - check, empriric Rx.  SignedBjorn Loser Barrett PA-C 06/02/2011, 8:47 AM  Patient seen and examined and history reviewed. Agree with above findings and plan. Exam is benign. Lungs are clear. CV without murmur or rub. Ecg shows subtle ST elevation in the inferior leads compared to prior tracing. Not diagnostic but given ongoing chest pain it is felt that he would benefit from emergent cardiac cath to define coronary anatomy and help guide therapy. Procedure reviewed with patient and he is agreeable to proceed.   Thedora Hinders 06/02/2011 9:49 AM

## 2011-06-02 NOTE — ED Provider Notes (Addendum)
History     CSN: [id number]  Arrival date & time 06/02/11  7829   First MD Initiated Contact with Patient 06/02/11 [phone]      Chief Complaint  Patient presents with  . Code STEMI    (Consider location/radiation/quality/duration/timing/severity/associated sxs/prior treatment) HPI  51yoM h/o HTN presents with chest pain. She states the pain has been present for 3-4 days intermittently. He states it started possibly Friday probably Saturday and resolved.  Returned last night and was still  Present when he woke up this morning. He describes it as a dull substernal chest pressure "discomfort, I can't really describe it". He is currently having a discomfort which he rates as 3-4/10. +pleuritic. No exertional component. There is no radiation.  He denies nausea, vomiting, diaphoresis. He denies back pain. He denies abdominal pain. Denies shortness of breath. He denies fevers, chills, cough. He did have a recent trip 5-1/2 hours 4 days ago to [place]. He also travels frequently with his job included trip to [place] 2 weeks ago. Denies h/o VTE in self or family. No recent hosp/surg. No h/o cancer. Denies exogenous hormone use, no leg pain or swelling.  He is a former smoker and has a history of hypertension as well as a family history of coronary artery disease. He states that his last stress test was for 5 years ago which was normal per pt  ED Notes, ED Provider Notes from 06/02/11 0000 to 06/02/11 08:06:12       Dalbert Batman Reagan, RN 06/02/2011 08:01      Pt states "this started Saturday, went away and came back yesterday, I kind of have a h/a, I've seen a cardiologist with [facility] but they can't find anything, haven't had any nausea or vomiting"    Past Medical History  Diagnosis Date  . Hypertension     Past Surgical History  Procedure Date  . Hernia repair   . Colon surgery   . Nasal sinus surgery     polyp removal    No family history on file.  History  Substance Use Topics    . Smoking status: Former Games developer  . Smokeless tobacco: Not on file  . Alcohol Use: No    Review of Systems  All other systems reviewed and are negative.  except as noted HPI  Allergies  Penicillins  Home Medications  No current outpatient prescriptions on file.  BP 142/74  Pulse 77  Temp(Src) 98.5 F (36.9 C) (Oral)  Resp 15  Wt 220 lb (99.791 kg)  SpO2 98%  Physical Exam  Nursing note and vitals reviewed. Constitutional: He is oriented to person, place, and time. He appears well-developed and well-nourished. No distress.  HENT:  Head: Atraumatic.  Mouth/Throat: Oropharynx is clear and moist.  Eyes: Conjunctivae are normal. Pupils are equal, round, and reactive to light.  Neck: Neck supple.  Cardiovascular: Normal rate, regular rhythm, normal heart sounds and intact distal pulses.  Exam reveals no gallop and no friction rub.   No murmur heard. Pulmonary/Chest: Effort normal. No respiratory distress. He has no wheezes. He has no rales. He exhibits no tenderness.  Abdominal: Soft. Bowel sounds are normal. There is no tenderness. There is no rebound and no guarding.  Musculoskeletal: Normal range of motion. He exhibits no edema and no tenderness.       No calf ttp  Neurological: He is alert and oriented to person, place, and time.  Skin: Skin is warm and dry.  Psychiatric: He has a  normal mood and affect.    Date: 06/02/2011  Rate: 80  Rhythm: normal sinus rhythm  QRS Axis: normal  Intervals: normal  ST/T Wave abnormalities: nonspecific T wave changes, ST elevations inferiorly and ST elevations laterally  Conduction Disutrbances:none  Narrative Interpretation:   Old EKG Reviewed: changes noted New STE ?repol compared to EKG 10/2008   ED Course  Procedures (including critical care time)  CRITICAL CARE Performed by: Forbes Cellar   Total critical care time: 60  Critical care time was exclusive of separately billable procedures and treating other  patients.  Critical care was necessary to treat or prevent imminent or life-threatening deterioration.  Critical care was time spent personally by me on the following activities: development of treatment plan with patient and/or surrogate as well as nursing, discussions with consultants, evaluation of patient's response to treatment, examination of patient, obtaining history from patient or surrogate, ordering and performing treatments and interventions, ordering and review of laboratory studies, ordering and review of radiographic studies, pulse oximetry and re-evaluation of patient's condition.   Labs Reviewed  DIFFERENTIAL - Abnormal; Notable for the following:    Monocytes Relative 17 (*)    Eosinophils Relative 7 (*)    All other components within normal limits  BASIC METABOLIC PANEL - Abnormal; Notable for the following:    Glucose, Bld 113 (*)    All other components within normal limits  CBC  D-DIMER, QUANTITATIVE  TROPONIN I   Dg Chest 2 View  06/02/2011  *RADIOLOGY REPORT*  Clinical Data: Chest pain, shortness of breath.  CHEST - 2 VIEW  Comparison: 10/26/2006  Findings: Heart and mediastinal contours are within normal limits. No focal opacities or effusions.  No acute bony abnormality.  IMPRESSION: No active cardiopulmonary disease.  Original Report Authenticated By: Cyndie Chime, M.D.    1. Chest pain    MDM  Patient presents with chest pain. Atypical in nature with new EKG changes. He does have risk factors for acute coronary syndrome including hypertension, former smoker, family history. He also has risk factors for pulmonary embolism given his recent travel. The chest pain is also pruritic. We'll screen with primary. Immediately after evaluation secretary staff notified to page cardiology for the EKG to be faxed over. NTG, asa, morphine.   EKG reviewed previously by Dr. Preston Fleeting (unk exact time). I reviewed the EKG at 0820 and evaluated the patient.   [id number] Cardiology  paged  [phone] Fax transmission confirmed.  [id number] Cardiology re paged  [phone] [facility] called back after reviewing EKGs. Concerning with new changes. Do not activate STEMI protocol at this time.  1478 Cardiology activated as STEMI by them via carelink. PA states there is no CODE STEMI order that needs to be activated by me at this time. Heparin bolus and transfer. Discussed dose of heparin with pharmacy. Pt 99kg, max dose 4000units. Discussed with nursing staff.  [phone] Asked by nursing staff to put CODE STEMI order in EPIC although previously activated by cardiology.  Forbes Cellar, MD 06/02/11 2130  Forbes Cellar, MD 06/02/11 [phone]

## 2011-06-02 NOTE — ED Notes (Signed)
Per MD, Pt now Code Stemi.

## 2011-06-02 NOTE — Progress Notes (Signed)
*  PRELIMINARY RESULTS* Echocardiogram 2D Echocardiogram has been performed.  Darrell Coleman 06/02/2011, 5:03 PM 

## 2011-06-03 ENCOUNTER — Other Ambulatory Visit: Payer: Self-pay

## 2011-06-03 DIAGNOSIS — R079 Chest pain, unspecified: Secondary | ICD-10-CM

## 2011-06-03 DIAGNOSIS — E785 Hyperlipidemia, unspecified: Secondary | ICD-10-CM

## 2011-06-03 LAB — LIPID PANEL
LDL Cholesterol: 52 mg/dL (ref 0–99)
Triglycerides: 187 mg/dL — ABNORMAL HIGH (ref <150)
VLDL: 37 mg/dL (ref 0–40)

## 2011-06-03 LAB — COMPREHENSIVE METABOLIC PANEL
AST: 12 U/L (ref 0–37)
Albumin: 3.7 g/dL (ref 3.5–5.2)
Chloride: 105 mEq/L (ref 96–112)
Creatinine, Ser: 1.02 mg/dL (ref 0.50–1.35)
Potassium: 3.9 mEq/L (ref 3.5–5.1)
Sodium: 140 mEq/L (ref 135–145)
Total Bilirubin: 0.7 mg/dL (ref 0.3–1.2)

## 2011-06-03 MED ORDER — METOPROLOL SUCCINATE ER 25 MG PO TB24: 25.0000 mg | ORAL_TABLET | Freq: Every day | ORAL | Status: DC

## 2011-06-03 MED ORDER — ATORVASTATIN CALCIUM 10 MG PO TABS
10.0000 mg | ORAL_TABLET | Freq: Every day | ORAL | Status: DC
Start: 2011-06-03 — End: 2011-06-03
  Filled 2011-06-03: qty 1

## 2011-06-03 MED ORDER — ATORVASTATIN CALCIUM 10 MG PO TABS: 10.0000 mg | ORAL_TABLET | Freq: Every day | ORAL | Status: DC

## 2011-06-03 MED FILL — Fentanyl Citrate Inj 0.05 MG/ML: INTRAMUSCULAR | Qty: 2 | Status: AC

## 2011-06-03 MED FILL — Heparin Sodium (Porcine) 2 Unit/ML in Sodium Chloride 0.9%: INTRAMUSCULAR | Qty: 2000 | Status: AC

## 2011-06-03 MED FILL — Nitroglycerin IV Soln 200 MCG/ML in D5W: INTRAVENOUS | Qty: 1 | Status: AC

## 2011-06-03 MED FILL — Lidocaine HCl Local Preservative Free (PF) Inj 1%: INTRAMUSCULAR | Qty: 2 | Status: AC

## 2011-06-03 MED FILL — Midazolam HCl Inj 2 MG/2ML (Base Equivalent): INTRAMUSCULAR | Qty: 2 | Status: AC

## 2011-06-03 NOTE — Discharge Instructions (Signed)
NO HEAVY LIFTING OR SEXUAL ACTIVITY X 7 DAYS. NO DRIVING X 2 DAYS. NO SOAKING BATHS, HOT TUBS, POOLS, ETC., X 5 DAYS.   You have a Stress Test scheduled on April 9th, at 8:45am at [facility] [address] [place], [phone] No food/drink after midnight before. No caffeine/decaf products 24hr before, including meds such as Excedrin or Goody Powders. Call if there are any questions. OK to take am meds with a sip of water. Arrive about 15 min early for paperwork. Wear comfortable clothes and shoes. Do NOT take: Beta blockers such as metoprolol/lopressor/Toprol XL or calcium channel blockers such as cardizem/Diltiazem or verapmil/Calan for 24 hours before the test.  Remove nitroglycerin patches and do not take nitrate preparations such as Imdur/isosorbide. No Persantine/Theophylline or Aggrenox meds should be used within 24 hours of the test. Discuss with MD what to do about diabetes meds if you take these.  When you arrive in the lab, the technician will inject a small amount of radioactive tracer. After a waiting period, resting pictures will be obtained.   You will be prepped for the stress portion of the test. With the stress (medical or treadmill), another small amount of radioactive tracer will be injected.  You will get a second set of pictures after a waiting period.   The whole test will take several hours. Chest Pain (Nonspecific) Chest pain has many causes. Your pain could be caused by something serious, such as a heart attack or a blood clot in the lungs. It could also be caused by something less serious, such as a chest bruise or a virus. Follow up with your doctor. More lab tests or other studies may be needed to find the cause of your pain. Most of the time, nonspecific chest pain will improve within 2 to 3 days of rest and mild pain medicine. HOME CARE  For chest bruises, you may put ice on the sore area for 15 to 20 minutes, 3 to 4 times a day. Do this only if it makes you  or your child feel better.   Put ice in a plastic bag.   Place a towel between the skin and the bag.   Rest for the next 2 to 3 days.   Go back to work if the pain improves.   See your doctor if the pain lasts longer than 1 to 2 weeks.   Only take medicine as told by your doctor.   Quit smoking if you smoke.  GET HELP RIGHT AWAY IF:   There is more pain or pain that spreads to the arm, neck, jaw, back, or belly (abdomen).   You or your child has shortness of breath.   You or your child coughs more than usual or coughs up blood.   You or your child has very bad back or belly pain, feels sick to his or her stomach (nauseous), or throws up (vomits).   You or your child has very bad weakness.   You or your child passes out (faints).   You or your child has a temperature by mouth above 102 F (38.9 C), not controlled by medicine.  Any of these problems may be serious and may be an emergency. Do not wait to see if the problems will go away. Get medical help right away. Call your local emergency services 911 in [place] Do not drive yourself to the hospital. MAKE SURE YOU:   Understand these instructions.   Will watch this condition.   Will  get help right away if you or your child is not doing well or gets worse.  Document Released: 08/06/2007 Document Revised: 02/06/2011 Document Reviewed: 08/06/2007 [facility] Patient Information 2012 [place], [place].Chest Pain, Nonspecific It is often hard to give a specific diagnosis for the cause of chest pain. There is always a chance that your pain could be related to something serious, like a heart attack or a blood clot in the lungs. You need to follow up with your caregiver for further evaluation. More lab tests or other studies such as X-rays, electrocardiography, stress testing, or cardiac imaging may be needed to find the cause of your pain. Most of the time, nonspecific chest pain improves within 2 to 3 days with rest and mild pain  medicine. For the next few days, avoid physical exertion or activities that bring on pain. Do not smoke. Avoid drinking alcohol. Call your caregiver for routine follow-up as advised.  SEEK IMMEDIATE MEDICAL CARE IF:  You develop increased chest pain or pain that radiates to the arm, neck, jaw, back, or abdomen.   You develop shortness of breath, increased coughing, or you start coughing up blood.   You have severe back or abdominal pain, nausea, or vomiting.   You develop severe weakness, fainting, fever, or chills.  Document Released: 02/17/2005 Document Revised: 02/06/2011 Document Reviewed: 08/07/2006 [employer] Patient Information 2012 [employer], [place]hest Pain, Nonspecific It is often hard to give a specific diagnosis for the cause of chest pain. There is always a chance that your pain could be related to something serious, like a heart attack or a blood clot in the lungs. You need to follow up with your caregiver for further evaluation. More lab tests or other studies such as X-rays, electrocardiography, stress testing, or cardiac imaging may be needed to find the cause of your pain. Most of the time, nonspecific chest pain improves within 2 to 3 days with rest and mild pain medicine. For the next few days, avoid physical exertion or activities that bring on pain. Do not smoke. Avoid drinking alcohol. Call your caregiver for routine follow-up as advised.  SEEK IMMEDIATE MEDICAL CARE IF:  You develop increased chest pain or pain that radiates to the arm, neck, jaw, back, or abdomen.   You develop shortness of breath, increased coughing, or you start coughing up blood.   You have severe back or abdominal pain, nausea, or vomiting.   You develop severe weakness, fainting, fever, or chills.  Document Released: 02/17/2005 Document Revised: 02/06/2011 Document Reviewed: 08/07/2006 [facility] Patient Information 2012 [place], [place].Marland Kitchen

## 2011-06-03 NOTE — Discharge Summary (Signed)
CARDIOLOGY DISCHARGE SUMMARY   Patient ID: Darrell Coleman MRN: [id number] DOB/AGE: [birth year] 51 y.o.  Admit date: 06/02/2011 Discharge date: 06/03/2011  Primary Discharge Diagnosis:  Chest pain Secondary Discharge Diagnosis:  Patient Active Problem List  Diagnoses  . Chest pain  . HTN (hypertension)  . Dyslipidemia   Procedures: 2D echo, Left Heart Cath, Selective Coronary Angiography, LV angiography   Hospital Course: Darrell Coleman is a 51 year old male with no previous history of coronary artery disease. He had an episode of chest pain that started the night before admission. When his symptoms did not resolve, he came to the hospital where his ECG was abnormal. He was having ongoing chest pain and was taken directly to the cath lab.  The cardiac cath results are listed below. Dr. Swaziland evaluated the films and felt that medical therapy was the best option. His chest pain resolved.he was admitted overnight for further evaluation.   His cardiac enzymes remained negative. A 2-D echo cardiogram was performed and preliminary results are that it contained no significant abnormalities. His rhythm remained stable overnight. He had a beta blocker added to his medication regimen and his lipid profile was reviewed, a statin recommended. On 06/03/2011, Darrell Coleman was seen by Dr. Swaziland. He was ambulating without chest pain or shortness of breath and is considered stable for discharge, to follow up with an outpatient Exercise Myoview and then an office visit.  Labs:   Lab Results  Component Value Date   WBC 6.0 06/02/2011   HGB 13.0 06/02/2011   HCT 36.5* 06/02/2011   MCV 91.5 06/02/2011   PLT 183 06/02/2011    Lab 06/03/11 0400  NA 140  K 3.9  CL 105  CO2 27  BUN 9  CREATININE 1.02  CALCIUM 9.2  PROT 6.8  BILITOT 0.7  ALKPHOS 40  ALT 16  AST 12  GLUCOSE 97    Basename 06/03/11 0001 06/02/11 1755 06/02/11 1225  CKTOTAL 60 64 69  CKMB 1.3 1.2 1.2  CKMBINDEX -- -- --  TROPONINI <0.30 <0.30  <0.30   Lipid Panel     Component Value Date/Time   CHOL 123 06/03/2011 0400   TRIG 187* 06/03/2011 0400   HDL 34* 06/03/2011 0400   CHOLHDL 3.6 06/03/2011 0400   VLDL 37 06/03/2011 0400   LDLCALC 52 06/03/2011 0400     Radiology: Dg Chest 2 View 06/02/2011  *RADIOLOGY REPORT*  Clinical Data: Chest pain, shortness of breath.  CHEST - 2 VIEW  Comparison: 10/26/2006  Findings: Heart and mediastinal contours are within normal limits. No focal opacities or effusions.  No acute bony abnormality.  IMPRESSION: No active cardiopulmonary disease.  Original Report Authenticated By: Cyndie Chime, M.D.    Cardiac cath:Left mainstem: Normal.  Left anterior descending (LAD): The LAD extends to the apex. It gives rise to a single diagonal branch. There is a long segment of 40% narrowing in the mid LAD. This has a smooth appearance. There is 30% eccentric disease in the distal LAD. The first diagonal has an 80% ostial stenosis.  Left circumflex (LCx): The left circumflex coronary gives rise to 2 marginal branches. It has scattered irregularities in the proximal mid vessel up to 20%.  Right coronary artery (RCA): The right coronary is a large dominant vessel. There is 20-30% disease in the proximal to mid vessel. The distal vessel there are minor irregularities less than 10%.  Left ventriculography: Left ventricular systolic function is normal, LVEF is estimated at 55-65%, there is  no significant mitral regurgitation  Final Conclusions:  1. Single vessel atherosclerotic coronary disease. There is a moderate to severe lesion at the ostium of the diagonal. There is a long segment of narrowing in the mid LAD which may represent a myocardial bridge.  2. Normal left ventricular function.  Recommendations: Recommend medical therapy at this point with aspirin, beta blocker, and statin. Patient's chest pain has resolved. We'll check serial cardiac enzymes and ECG. Patient may need a functional study to assess the significance  of the LAD and diagonal disease.  EKG: 06/02/2011 [id number] 03-Jun-2011 08:23:13  Normal sinus rhythm Nonspecific T wave abnormality Abnormal ECG Vent. rate 70 BPM PR interval 168 ms QRS duration 98 ms QT/QTc 368/397 ms P-R-T axes 54 40 60  Echo: results pending  FOLLOW UP PLANS AND APPOINTMENTS Discharge Orders    Future Appointments: Provider: Department: Dept Phone: Center:   06/10/2011 8:45 AM [facility]  None   06/24/2011 8:30 AM Rosalio Macadamia, NP [facility] [phone] None     Future Orders Please Complete By Expires   Diet - low sodium heart healthy      Increase activity slowly      Call MD for:  redness, tenderness, or signs of infection (pain, swelling, redness, odor or green/yellow discharge around incision site)        Allergies  Allergen Reactions  . Penicillins     Unknown; "I was a child"   Medication List  As of 06/03/2011 10:04 AM   STOP taking these medications         lisinopril 20 MG tablet         TAKE these medications         aspirin 325 MG tablet   Take 325 mg by mouth daily.      atorvastatin 10 MG tablet   Commonly known as: LIPITOR   Take 1 tablet (10 mg total) by mouth daily at 6 PM.      febuxostat 40 MG tablet   Commonly known as: ULORIC   Take 40 mg by mouth daily.      metoprolol succinate 25 MG 24 hr tablet   Commonly known as: TOPROL-XL   Take 1 tablet (25 mg total) by mouth daily. Take with or immediately following a meal.      nitroGLYCERIN 0.3 MG SL tablet   Commonly known as: NITROSTAT   Place 0.3 mg under the tongue every 5 (five) minutes as needed. For chest pain           Follow-up Information    Follow up with Norma Fredrickson, NP. (See for Dr Swaziland on April 23rd at 8:30 am)    Contact information:   [address] [place] [address] [phone]       Follow up with Gwen Pounds, MD in 2 weeks. (As needed)    Contact information:     [address] [facility] [place]. [facility] [place] [address] [phone]          BRING ALL MEDICATIONS WITH YOU TO FOLLOW UP APPOINTMENTS  Time spent with patient to include physician time: 35 min Signed: Theodore Demark 06/03/2011, 10:04 AM Co-Sign MD

## 2011-06-03 NOTE — Progress Notes (Signed)
CARDIAC REHAB PHASE I   PRE:  Rate/Rhythm: 74 SR    BP: sitting 16109    SaO2: 100 RA  MODE:  Ambulation: 1000 ft   POST:  Rate/Rhythm: 91 SR    BP: sitting 60454     SaO2:   Tolerated well, brisk walk. Sts only chest tightness with deep inhalation. None walking. Felt good to walk. Reviewed diet, ex, restrictions, NTG.  [id number]  Harriet Masson CES, ACSM

## 2011-06-03 NOTE — Discharge Summary (Signed)
Patient seen and examined and history reviewed. Agree with above findings and plan. See my rounding note earlier today.   Darrell Coleman 06/03/2011 10:55 AM

## 2011-06-03 NOTE — Progress Notes (Signed)
TELEMETRY: Reviewed telemetry pt in NSR: Filed Vitals:   06/03/11 0000 06/03/11 0400 06/03/11 0500 06/03/11 0723  BP: 109/69 103/68    Pulse: 65 65 62   Temp: 98.9 F (37.2 C) 98.2 F (36.8 C)  98.7 F (37.1 C)  TempSrc: Oral Oral  Oral  Resp: 16     Weight:  100.7 kg (222 lb 0.1 oz)    SpO2: 98% 97% 97%     Intake/Output Summary (Last 24 hours) at 06/03/11 0814 Last data filed at 06/03/11 0200  Gross per 24 hour  Intake    720 ml  Output   3100 ml  Net  -2380 ml    SUBJECTIVE Still feels slight chest tightness when he takes a deep breath. No other chest pain. Slept well.  LABS: Basic Metabolic Panel:  Basename 06/03/11 0400 06/02/11 1217 06/02/11 0815  NA 140 -- 137  K 3.9 -- 3.5  CL 105 -- 102  CO2 27 -- 26  GLUCOSE 97 -- 113*  BUN 9 -- 10  CREATININE 1.02 0.84 --  CALCIUM 9.2 -- 9.8  MG -- -- --  PHOS -- -- --   Liver Function Tests:  Basename 06/03/11 0400  AST 12  ALT 16  ALKPHOS 40  BILITOT 0.7  PROT 6.8  ALBUMIN 3.7   No results found for this basename: LIPASE:2,AMYLASE:2 in the last 72 hours CBC:  Basename 06/02/11 1217 06/02/11 0815  WBC 6.0 5.9  NEUTROABS -- 3.3  HGB 13.0 13.9  HCT 36.5* 39.4  MCV 91.5 91.0  PLT 183 208   Cardiac Enzymes:  Basename 06/03/11 0001 06/02/11 1755 06/02/11 1225  CKTOTAL 60 64 69  CKMB 1.3 1.2 1.2  CKMBINDEX -- -- --  TROPONINI <0.30 <0.30 <0.30   BNP: No components found with this basename: POCBNP:3 D-Dimer:  Basename 06/02/11 0815  DDIMER 0.32   Hemoglobin A1C: No results found for this basename: HGBA1C in the last 72 hours Fasting Lipid Panel:  Basename 06/03/11 0400  CHOL 123  HDL 34*  LDLCALC 52  TRIG 161*  CHOLHDL 3.6  LDLDIRECT --   Thyroid Function Tests:  Basename 06/02/11 1217  TSH 2.270  T4TOTAL --  T3FREE --  THYROIDAB --   Anemia Panel: No results found for this basename: VITAMINB12,FOLATE,FERRITIN,TIBC,IRON,RETICCTPCT in the last 72 hours  Radiology/Studies:  Dg  Chest 2 View  06/02/2011  *RADIOLOGY REPORT*  Clinical Data: Chest pain, shortness of breath.  CHEST - 2 VIEW  Comparison: 10/26/2006  Findings: Heart and mediastinal contours are within normal limits. No focal opacities or effusions.  No acute bony abnormality.  IMPRESSION: No active cardiopulmonary disease.  Original Report Authenticated By: Cyndie Chime, M.D.    PHYSICAL EXAM General: Well developed, well nourished, in no acute distress. Head: Normocephalic, atraumatic, sclera non-icteric, no xanthomas, nares are without discharge. Neck: Negative for carotid bruits. JVD not elevated. Lungs: Clear bilaterally to auscultation without wheezes, rales, or rhonchi. Breathing is unlabored. Heart: RRR S1 S2 without murmurs, rubs, or gallops.  Abdomen: Soft, non-tender, non-distended with normoactive bowel sounds. No hepatomegaly. No rebound/guarding. No obvious abdominal masses. Msk:  Strength and tone appears normal for age. Extremities: No clubbing, cyanosis or edema.  Distal pedal pulses are 2+ and equal bilaterally. No hematoma at radial cath site. Neuro: Alert and oriented X 3. Moves all extremities spontaneously. Psych:  Responds to questions appropriately with a normal affect.  ASSESSMENT AND PLAN: 1. Chest pain. Atypical. Negative d-dimer. Modest CAD in diagonal. Long  smooth 40% mid LAD disease. 2. Dyslipidemia. Low HDL, elevated trig. 3. HTN BP well controlled.  Plan: I reviewed ECHO (not reported yet) and it looks normal. Will check Ecg this am. Ambulate with cardiac Rehab. Plan on discharge today on ASA, statin lipitor 10 mg daily, and metoprolol. Stop ACEi for now. Will schedule for outpatient stress myoview then follow up OV.  Principal Problem:  *STEMI (ST elevation myocardial infarction) Active Problems:  HTN (hypertension)    Signed, Farhan Jean [place] MD,FACC 06/03/2011 8:19 AM

## 2011-06-03 NOTE — Progress Notes (Signed)
UR Completed. Simmons, [place] F [phone]  

## 2011-06-04 ENCOUNTER — Other Ambulatory Visit: Payer: Self-pay | Admitting: Cardiology

## 2011-06-04 MED ORDER — NITROGLYCERIN 0.3 MG SL SUBL: 0.3000 mg | SUBLINGUAL_TABLET | SUBLINGUAL | Status: DC | PRN

## 2011-06-04 NOTE — Telephone Encounter (Signed)
NTG 0.3 mg sl sent to [facility].

## 2011-06-04 NOTE — Telephone Encounter (Signed)
New msg Pt just got released from hospital and he said nitroglycerin rx was not at his pharmacy [facility] Please resend

## 2011-06-10 ENCOUNTER — Ambulatory Visit (HOSPITAL_COMMUNITY): Payer: BC Managed Care – PPO | Attending: Cardiovascular Disease | Admitting: Radiology

## 2011-06-10 DIAGNOSIS — I10 Essential (primary) hypertension: Secondary | ICD-10-CM | POA: Insufficient documentation

## 2011-06-10 DIAGNOSIS — Z8249 Family history of ischemic heart disease and other diseases of the circulatory system: Secondary | ICD-10-CM | POA: Insufficient documentation

## 2011-06-10 DIAGNOSIS — E785 Hyperlipidemia, unspecified: Secondary | ICD-10-CM | POA: Insufficient documentation

## 2011-06-10 DIAGNOSIS — Z87891 Personal history of nicotine dependence: Secondary | ICD-10-CM | POA: Insufficient documentation

## 2011-06-10 DIAGNOSIS — R0789 Other chest pain: Secondary | ICD-10-CM | POA: Insufficient documentation

## 2011-06-10 DIAGNOSIS — R9431 Abnormal electrocardiogram [ECG] [EKG]: Secondary | ICD-10-CM | POA: Insufficient documentation

## 2011-06-10 DIAGNOSIS — R079 Chest pain, unspecified: Secondary | ICD-10-CM

## 2011-06-10 MED ORDER — TECHNETIUM TC 99M TETROFOSMIN IV KIT
10.0000 | PACK | Freq: Once | INTRAVENOUS | Status: AC | PRN
  Administered 2011-06-10: 10 via INTRAVENOUS

## 2011-06-10 MED ORDER — TECHNETIUM TC 99M TETROFOSMIN IV KIT
30.0000 | PACK | Freq: Once | INTRAVENOUS | Status: AC | PRN
  Administered 2011-06-10: 30 via INTRAVENOUS

## 2011-06-10 NOTE — Progress Notes (Signed)
[facility] SITE 3 NUCLEAR MED [address] [place] [address] [phone]  Cardiology Nuclear Med Study  Darrell Coleman is a 51 y.o. male     MRN : [id number]     DOB: 15-Sep-1960  Procedure Date: 06/10/2011  Nuclear Med Background Indication for Stress Test:  Evaluation for Ischemia, 06/02/11 Post Hospital: Chest Pain > Heart Cath, (-) enzymes, and Abnormal EKG History: 06/02/2011: ECHO: EF: 50-55%,--Heart Cath EF: 55-65%, LAD single vessel athero CAD, 4/5 yrs ago: GXT: EAGLE: NL per pt Cardiac Risk Factors: Family History - CAD, History of Smoking, Hypertension and Lipids  Symptoms:  Chest Pain and Chest Pressure   Nuclear Pre-Procedure Caffeine/Decaff Intake:  None NPO After: 10:00pm   Lungs:  clear O2 Sat: 98% on room air. IV 0.9% NS with Angio Cath:  18g  IV Site: R Antecubital  IV Started by:  Stanton Kidney, EMT-P  Chest Size (in):  46 Cup Size: n/a  Height: 6\' 2"  (1.88 m)  Weight:  218 lb (98.884 kg)  BMI:  Body mass index is 27.99 kg/(m^2). Tech Comments:  Toprol held > 24 hours, per patient. The patient had (+) changes during exercise but came back to baseline as soon as he went into recovery.    Nuclear Med Study 1 or 2 day study: 1 day  Stress Test Type:  Stress  Reading MD: Charlton Haws, MD  Order Authorizing Provider:  Iziah Cates [place] MD  Resting Radionuclide: Technetium 55m Tetrofosmin  Resting Radionuclide Dose: 10.9 mCi   Stress Radionuclide:  Technetium 6m Tetrofosmin  Stress Radionuclide Dose: 32.8 mCi           Stress Protocol Rest HR: 58 Stress HR: 151  Rest BP: 118/74 Stress BP: 178/64  Exercise Time (min): 11:00 METS: 13.20   Predicted Max HR: 169 bpm % Max HR: 89.35 bpm Rate Pressure Product: 46962   Dose of Adenosine (mg):  n/a Dose of Lexiscan: n/a mg  Dose of Atropine (mg): n/a Dose of Dobutamine: n/a mcg/kg/min (at max HR)  Stress Test Technologist: Milana Na, EMT-P  Nuclear Technologist:  Domenic Polite, CNMT     Rest  Procedure:  Myocardial perfusion imaging was performed at rest 45 minutes following the intravenous administration of Technetium 71m Tetrofosmin. Rest ECG: NSR - Normal EKG  Stress Procedure:  The patient performed treadmill exercise using a Bruce  Protocol for 11:00 minutes. The patient stopped due to doe, and chest tightness.  There were + significant ST-T wave changes.  Technetium 30m Tetrofosmin was injected at peak exercise and myocardial perfusion imaging was performed after a brief delay. Stress ECG: No significant change from baseline ECG  QPS Raw Data Images:  Normal; no motion artifact; normal heart/lung ratio. Stress Images:  Normal homogeneous uptake in all areas of the myocardium. Rest Images:  Normal homogeneous uptake in all areas of the myocardium. Subtraction (SDS):  SDS 6 Transient Ischemic Dilatation (Normal <1.22):  1.05 Lung/Heart Ratio (Normal <0.45):  0.31  Quantitative Gated Spect Images QGS EDV:  124 ml QGS ESV:  61 ml  Impression Exercise Capacity:  Excellent exercise capacity. BP Response:  Normal blood pressure response. Clinical Symptoms:  There is dyspnea. ECG Impression:  No significant ST segment change suggestive of ischemia. Comparison with Prior Nuclear Study: No images to compare  Overall Impression:  Normal stress nuclear study.  LV Ejection Fraction: 51%.  LV Wall Motion:  NL LV Function; NL Wall Motion    Charlton Haws

## 2011-06-24 ENCOUNTER — Encounter: Payer: BC Managed Care – PPO | Admitting: Nurse Practitioner

## 2011-06-27 ENCOUNTER — Encounter: Payer: Self-pay | Admitting: Nurse Practitioner

## 2011-06-27 ENCOUNTER — Ambulatory Visit (INDEPENDENT_AMBULATORY_CARE_PROVIDER_SITE_OTHER): Payer: BC Managed Care – PPO | Admitting: Nurse Practitioner

## 2011-06-27 ENCOUNTER — Telehealth: Payer: Self-pay | Admitting: Nurse Practitioner

## 2011-06-27 VITALS — BP 120/82 | HR 60 | Resp 18 | Ht 74.0 in | Wt 226.0 lb

## 2011-06-27 DIAGNOSIS — I2510 Atherosclerotic heart disease of native coronary artery without angina pectoris: Secondary | ICD-10-CM

## 2011-06-27 DIAGNOSIS — E785 Hyperlipidemia, unspecified: Secondary | ICD-10-CM

## 2011-06-27 DIAGNOSIS — I10 Essential (primary) hypertension: Secondary | ICD-10-CM

## 2011-06-27 NOTE — Assessment & Plan Note (Signed)
Blood pressure looks good. No change with his current regimen.  

## 2011-06-27 NOTE — Progress Notes (Signed)
Vira Agar Date of Birth: 27-Sep-1960 Medical Record #[id number]  History of Present Illness: Mr. Inskeep is seen back today for a post hospital visit. He is seen for Dr. [place]. He was recently admitted with chest pain. Found to have an abnormal EKG. Underwent cath with basically single vessel CAD noted and normal LV function. Has had follow up Myoview testing which was satisfactory.   He comes in today. He is here with his wife. They have lots of questions. He does not really understand why he has coronary disease. Does have a positive family history. Cholesterol levels really not that bad but never had Lipomed testing.  Remains active. Does not smoke. He admits that he has been a little nervous since discharge. Has used a couple of NTG but overall thinks he is doing ok. He does not like to take medicines. Wants to get back into weight lifting.   Current Outpatient Prescriptions on File Prior to Visit  Medication Sig Dispense Refill  . aspirin 325 MG tablet Take 325 mg by mouth daily.      Marland Kitchen atorvastatin (LIPITOR) 10 MG tablet Take 1 tablet (10 mg total) by mouth daily at 6 PM.  30 tablet  11  . febuxostat (ULORIC) 40 MG tablet Take 40 mg by mouth daily.      Marland Kitchen LORATADINE PO Take by mouth as needed. 10-20 mg      . metoprolol succinate (TOPROL XL) 25 MG 24 hr tablet Take 1 tablet (25 mg total) by mouth daily. Take with or immediately following a meal.  30 tablet  11  . Multiple Vitamin (MULTI-VITAMIN PO) Take by mouth daily.      . nitroGLYCERIN (NITROSTAT) 0.3 MG SL tablet Place 1 tablet (0.3 mg total) under the tongue every 5 (five) minutes as needed. For chest pain  25 tablet  11  . Omega-3 Fatty Acids (FISH OIL) 1200 MG CAPS Take 1,200 mg by mouth daily.        Allergies  Allergen Reactions  . Penicillins     Unknown; "I was a child"    Past Medical History  Diagnosis Date  . Hypertension   . GERD (gastroesophageal reflux disease)   . Coronary artery disease     s/p cath  06/2011 with single vessel LAD disease; normal LV function; s/p normal Myoview testing; to manage medically  . Dyslipidemia   . Alcohol abuse, in remission     Past Surgical History  Procedure Date  . Nasal sinus surgery ~ 2006    polyp removal  . Cardiac catheterization 06/02/2011  . Hernia repair 08/2007    ventral  . Inguinal hernia repair     "as a child"; bilaterally  . Colectomy 10/2006    sigmoid  . Shoulder arthroscopy ~ 1996    right  . Hydrocelectomy     "as a kid"; left    History  Smoking status  . Former Smoker  . Types: Cigars  . Quit date: 03/03/1998  Smokeless tobacco  . Former Neurosurgeon  . Types: Chew  . Quit date: 03/04/1983    History  Alcohol Use  . Yes    "last drink was 2011"    Family History  Problem Relation Age of Onset  . Heart attack Father   . Hypertension Father   . Alzheimer's disease Mother     Review of Systems: The review of systems is per the HPI.  All other systems were reviewed and are negative.  Physical Exam: BP 120/82  Pulse 60  Resp 18  Ht 6\' 2"  (1.88 m)  Wt 226 lb (102.513 kg)  BMI 29.02 kg/m2 Patient is very pleasant and in no acute distress. Skin is warm and dry. Color is normal.  HEENT is unremarkable. Normocephalic/atraumatic. PERRL. Sclera are nonicteric. Neck is supple. No masses. No JVD. Lungs are clear. Cardiac exam shows a regular rate and rhythm. Abdomen is soft. Extremities are without edema. Gait and ROM are intact. No gross neurologic deficits noted.  LABORATORY DATA: Lab Results  Component Value Date   WBC 6.0 06/02/2011   HGB 13.0 06/02/2011   HCT 36.5* 06/02/2011   PLT 183 06/02/2011   GLUCOSE 97 06/03/2011   CHOL 123 06/03/2011   TRIG 187* 06/03/2011   HDL 34* 06/03/2011   LDLCALC 52 06/03/2011   ALT 16 06/03/2011   AST 12 06/03/2011   NA 140 06/03/2011   K 3.9 06/03/2011   CL 105 06/03/2011   CREATININE 1.02 06/03/2011   BUN 9 06/03/2011   CO2 27 06/03/2011   TSH 2.270 06/02/2011   INR 1.0 08/26/2007   Cardiac Cath Final  Conclusions:  1. Single vessel atherosclerotic coronary disease. There is a moderate to severe lesion at the ostium of the diagonal. There is a long segment of narrowing in the mid LAD which may represent a myocardial bridge.  2. Normal left ventricular function.  Recommendations: Recommend medical therapy at this point with aspirin, beta blocker, and statin. Patient's chest pain has resolved. We'll check serial cardiac enzymes and ECG. Patient may need a functional study to assess the significance of the LAD and diagonal disease.   Echo Study Conclusions  Left ventricle: The cavity size was normal. Wall thickness was normal. Systolic function was normal. The estimated ejection fraction was in the range of 50% to 55%. Wall motion was normal; there were no regional wall motion abnormalities. Left ventricular diastolic function parameters were normal.    Myoview Impression  Exercise Capacity: Excellent exercise capacity.  BP Response: Normal blood pressure response.  Clinical Symptoms: There is dyspnea.  ECG Impression: No significant ST segment change suggestive of ischemia.  Comparison with Prior Nuclear Study: No images to compare  Overall Impression: Normal stress nuclear study.  LV Ejection Fraction: 51%. LV Wall Motion: NL LV Function; NL Wall Motion  Assessment / Plan:

## 2011-06-27 NOTE — Telephone Encounter (Signed)
Wife wanted to know if ok to take Fish oil 1200 2 a day, ok per Lawson Fiscal NP

## 2011-06-27 NOTE — Assessment & Plan Note (Signed)
Patient is s/p cardiac cath. Does have known CAD primarily single vessel CAD. His myoview is satisfactory. We will continue with medical therapy. Risk factor modification is strongly encouraged. Aerobic activity is encouraged. I think overall he is doing ok. Will plan on seeing him back in 3 months. He does have NTG on hand. Patient is agreeable to this plan and will call if any problems develop in the interim.

## 2011-06-27 NOTE — Patient Instructions (Signed)
We are going to check a Lipomed profile in one month  You will see Dr. Swaziland in 3 months.  Stay active  Call the [facility] office at [phone] if you have any questions, problems or concerns.

## 2011-06-27 NOTE — Assessment & Plan Note (Signed)
Will plan on checking a lipomed in one month with LFT's. He remains on Lipitor. May need to consider Niaspan in the future.

## 2011-06-27 NOTE — Telephone Encounter (Signed)
New msg Pt's wife called. She forgot to ask some questions at his appt this morning. Please call

## 2011-06-27 NOTE — Telephone Encounter (Signed)
Left message

## 2011-07-25 ENCOUNTER — Telehealth: Payer: Self-pay

## 2011-07-25 ENCOUNTER — Other Ambulatory Visit (INDEPENDENT_AMBULATORY_CARE_PROVIDER_SITE_OTHER): Payer: BC Managed Care – PPO

## 2011-07-25 DIAGNOSIS — E785 Hyperlipidemia, unspecified: Secondary | ICD-10-CM

## 2011-07-25 LAB — HEPATIC FUNCTION PANEL
ALT: 20 U/L (ref 0–53)
AST: 17 U/L (ref 0–37)
Albumin: 4.1 g/dL (ref 3.5–5.2)
Alkaline Phosphatase: 32 U/L — ABNORMAL LOW (ref 39–117)
Bilirubin, Direct: 0.1 mg/dL (ref 0.0–0.3)
Total Bilirubin: 0.7 mg/dL (ref 0.3–1.2)
Total Protein: 6.9 g/dL (ref 6.0–8.3)

## 2011-07-25 MED ORDER — ATORVASTATIN CALCIUM 10 MG PO TABS: 10.0000 mg | ORAL_TABLET | Freq: Every day | ORAL | Status: DC

## 2011-07-25 MED ORDER — METOPROLOL SUCCINATE ER 25 MG PO TB24: 25.0000 mg | ORAL_TABLET | Freq: Every day | ORAL | Status: DC

## 2011-07-25 NOTE — Telephone Encounter (Signed)
Patient came by office needs 90 day supply refills sent to [facility] on atorvastatin,metoprolol.Refills sent to [facility].

## 2011-07-25 NOTE — Telephone Encounter (Signed)
Patient came by office stated he needed refills on atorvastatin and metoprolol 90 day supply sent to [facility].

## 2011-07-27 LAB — NMR LIPOPROFILE WITH LIPIDS
Cholesterol, Total: 88 mg/dL (ref <200)
HDL Particle Number: 29 umol/L — ABNORMAL LOW (ref >=30.5)
HDL-C: 37 mg/dL — ABNORMAL LOW (ref >=40)
LDL (calc): 32 mg/dL (ref <100)
LDL Particle Number: 616 nmol/L (ref <1000)
LDL Size: 20.5 nm — ABNORMAL LOW (ref >20.5)
LP-IR Score: 50 — ABNORMAL HIGH (ref <=45)
Small LDL Particle Number: 443 nmol/L (ref <=527)
Triglycerides: 93 mg/dL (ref <150)

## 2011-08-01 ENCOUNTER — Telehealth: Payer: Self-pay | Admitting: Cardiology

## 2011-09-26 ENCOUNTER — Encounter: Payer: Self-pay | Admitting: Cardiology

## 2011-09-26 ENCOUNTER — Ambulatory Visit (INDEPENDENT_AMBULATORY_CARE_PROVIDER_SITE_OTHER): Payer: BC Managed Care – PPO | Admitting: Cardiology

## 2011-09-26 VITALS — BP 121/80 | HR 72 | Ht 74.0 in | Wt 225.0 lb

## 2011-09-26 DIAGNOSIS — E785 Hyperlipidemia, unspecified: Secondary | ICD-10-CM

## 2011-09-26 DIAGNOSIS — I2510 Atherosclerotic heart disease of native coronary artery without angina pectoris: Secondary | ICD-10-CM

## 2011-09-26 DIAGNOSIS — R5383 Other fatigue: Secondary | ICD-10-CM

## 2011-09-26 DIAGNOSIS — I10 Essential (primary) hypertension: Secondary | ICD-10-CM

## 2011-09-26 DIAGNOSIS — R5381 Other malaise: Secondary | ICD-10-CM

## 2011-09-26 MED ORDER — LISINOPRIL 20 MG PO TABS: 20.0000 mg | ORAL_TABLET | Freq: Every day | ORAL | Status: AC

## 2011-09-26 NOTE — Patient Instructions (Signed)
Stop metoprolol and resume lisinopril 20 mg daily.  Continue your other therapy.  I will see you again in 6 months.

## 2011-09-26 NOTE — Assessment & Plan Note (Signed)
Blood pressure is well controlled. We will monitor as we switch back to lisinopril but he had tolerated this well before.

## 2011-09-26 NOTE — Assessment & Plan Note (Signed)
His lipoprotein analysis in April looked excellent. We will continue with his current dose of Lipitor.

## 2011-09-26 NOTE — Progress Notes (Signed)
Darrell Coleman Date of Birth: 1960-06-20 Medical Record #[id number]  History of Present Illness: Darrell Coleman is seen back today for followup. He has done very well cardiac standpoint. He is exercising regularly. He has had no further symptoms of chest pain. After his initial diagnosis of coronary disease he did have some atypical chest pain symptoms for about a month but then these resolved. He does complain that he is more fatigued particularly in the middle of the day and gives out more easily when he is out in the sun. He thinks this may be related to his medication.  Current Outpatient Prescriptions on File Prior to Visit  Medication Sig Dispense Refill  . aspirin 325 MG tablet Take 325 mg by mouth daily.      Marland Kitchen atorvastatin (LIPITOR) 10 MG tablet Take 1 tablet (10 mg total) by mouth daily at 6 PM.  90 tablet  3  . febuxostat (ULORIC) 40 MG tablet Take 40 mg by mouth daily.      Marland Kitchen LORATADINE PO Take by mouth as needed. 10-20 mg      . Multiple Vitamin (MULTI-VITAMIN PO) Take by mouth daily.      . nitroGLYCERIN (NITROSTAT) 0.3 MG SL tablet Place 1 tablet (0.3 mg total) under the tongue every 5 (five) minutes as needed. For chest pain  25 tablet  11  . Omega-3 Fatty Acids (FISH OIL) 1200 MG CAPS Take 1,200 mg by mouth 2 (two) times daily.         Allergies  Allergen Reactions  . Penicillins     Unknown; "I was a child"    Past Medical History  Diagnosis Date  . Hypertension   . GERD (gastroesophageal reflux disease)   . Coronary artery disease     s/p cath 06/2011 with single vessel LAD disease; normal LV function; s/p normal Myoview testing; to manage medically  . Dyslipidemia   . Alcohol abuse, in remission     Past Surgical History  Procedure Date  . Nasal sinus surgery ~ 2006    polyp removal  . Cardiac catheterization 06/02/2011  . Hernia repair 08/2007    ventral  . Inguinal hernia repair     "as a child"; bilaterally  . Colectomy 10/2006    sigmoid  . Shoulder  arthroscopy ~ 1996    right  . Hydrocelectomy     "as a kid"; left    History  Smoking status  . Former Smoker  . Types: Cigars  . Quit date: 03/03/1998  Smokeless tobacco  . Former Neurosurgeon  . Types: Chew  . Quit date: 03/04/1983    History  Alcohol Use  . Yes    "last drink was 2011"    Family History  Problem Relation Age of Onset  . Heart attack Father   . Hypertension Father   . Alzheimer's disease Mother     Review of Systems: The review of systems is per the HPI.  All other systems were reviewed and are negative.  Physical Exam: BP 121/80  Pulse 72  Ht 6\' 2"  (1.88 m)  Wt 225 lb (102.059 kg)  BMI 28.89 kg/m2 Patient is very pleasant and in no acute distress. Skin is warm and dry. Color is normal.  HEENT is unremarkable. Normocephalic/atraumatic. PERRL. Sclera are nonicteric. Neck is supple. No masses. No JVD. Lungs are clear. Cardiac exam shows a regular rate and rhythm. Abdomen is soft. Extremities are without edema. Gait and ROM are intact. No gross  neurologic deficits noted.  LABORATORY DATA:    Assessment / Plan:

## 2011-09-26 NOTE — Assessment & Plan Note (Signed)
Prior cardiac catheterization demonstrated an 80% stenosis he proximal diagonal branch. The LAD had some mild bridging in the mid vessel. He is asymptomatic. His followup nuclear stress test was negative for ischemia. I do feel that his fatigue is related to metoprolol. He previously was taking lisinopril for blood pressure. Since he is having no significant angina and had no ischemia on stress testing we will switch his metoprolol back to lisinopril.

## 2012-08-30 ENCOUNTER — Other Ambulatory Visit: Payer: Self-pay | Admitting: Cardiology

## 2012-08-31 NOTE — Telephone Encounter (Signed)
atorvastatin (LIPITOR) 10 MG tablet  Take 1 tablet (10 mg total) by mouth daily at 6 PM.   90 tablet    Dyslipidemia - [name], MD at 09/26/2011 10:10 AM  Status: Written Related Problem: Dyslipidemia  His lipoprotein analysis in April looked excellent. We will continue with his current dose of Lipitor.

## 2012-09-24 ENCOUNTER — Emergency Department (HOSPITAL_BASED_OUTPATIENT_CLINIC_OR_DEPARTMENT_OTHER)
Admission: EM | Admit: 2012-09-24 | Discharge: 2012-09-25 | Disposition: A | Discharge status: Home / Self Care | Payer: BC Managed Care – PPO | Attending: Emergency Medicine | Admitting: Emergency Medicine

## 2012-09-24 ENCOUNTER — Encounter (HOSPITAL_BASED_OUTPATIENT_CLINIC_OR_DEPARTMENT_OTHER): Payer: Self-pay | Admitting: *Deleted

## 2012-09-24 DIAGNOSIS — W268XXA Contact with other sharp object(s), not elsewhere classified, initial encounter: Secondary | ICD-10-CM | POA: Insufficient documentation

## 2012-09-24 DIAGNOSIS — Y929 Unspecified place or not applicable: Secondary | ICD-10-CM | POA: Insufficient documentation

## 2012-09-24 DIAGNOSIS — F1011 Alcohol abuse, in remission: Secondary | ICD-10-CM | POA: Insufficient documentation

## 2012-09-24 DIAGNOSIS — Y9389 Activity, other specified: Secondary | ICD-10-CM | POA: Insufficient documentation

## 2012-09-24 DIAGNOSIS — S61209A Unspecified open wound of unspecified finger without damage to nail, initial encounter: Secondary | ICD-10-CM | POA: Insufficient documentation

## 2012-09-24 DIAGNOSIS — I2510 Atherosclerotic heart disease of native coronary artery without angina pectoris: Secondary | ICD-10-CM | POA: Insufficient documentation

## 2012-09-24 DIAGNOSIS — Z79899 Other long term (current) drug therapy: Secondary | ICD-10-CM | POA: Insufficient documentation

## 2012-09-24 DIAGNOSIS — I10 Essential (primary) hypertension: Secondary | ICD-10-CM | POA: Insufficient documentation

## 2012-09-24 DIAGNOSIS — S61219A Laceration without foreign body of unspecified finger without damage to nail, initial encounter: Secondary | ICD-10-CM

## 2012-09-24 DIAGNOSIS — Z880 Allergy status to penicillin: Secondary | ICD-10-CM | POA: Insufficient documentation

## 2012-09-24 DIAGNOSIS — E785 Hyperlipidemia, unspecified: Secondary | ICD-10-CM | POA: Insufficient documentation

## 2012-09-24 DIAGNOSIS — Z8719 Personal history of other diseases of the digestive system: Secondary | ICD-10-CM | POA: Insufficient documentation

## 2012-09-24 DIAGNOSIS — Z7982 Long term (current) use of aspirin: Secondary | ICD-10-CM | POA: Insufficient documentation

## 2012-09-24 NOTE — ED Notes (Signed)
Pt c/o laceration to right 5th finger by glass x 1 hr ago

## 2012-09-25 NOTE — ED Provider Notes (Signed)
CSN: [id number]     Arrival date & time 09/24/12  2217 History     First MD Initiated Contact with Patient 09/24/12 2332     Chief Complaint  Patient presents with  . Extremity Laceration   (Consider location/radiation/quality/duration/timing/severity/associated sxs/prior Treatment) HPI This 52 year old male has a laceration to his right small finger tip from glass that broke while he was washing dishes just prior to arrival, bleeding is controlled with local pressure, he is no foreign body sensation, he is no weakness or numbness, he is no bony pain or joint pain, his tetanus status up-to-date within the last year or 2, he has no other injuries or concerns, his pain is moderate and localized without radiation or associated symptoms. Treatment prior to arrival consisted of pressure to control the bleeding. Past Medical History  Diagnosis Date  . Hypertension   . GERD (gastroesophageal reflux disease)   . Coronary artery disease     s/p cath 06/2011 with single vessel LAD disease; normal LV function; s/p normal Myoview testing; to manage medically  . Dyslipidemia   . Alcohol abuse, in remission    Past Surgical History  Procedure Laterality Date  . Nasal sinus surgery  ~ 2006    polyp removal  . Cardiac catheterization  06/02/2011  . Hernia repair  08/2007    ventral  . Inguinal hernia repair      "as a child"; bilaterally  . Colectomy  10/2006    sigmoid  . Shoulder arthroscopy  ~ 1996    right  . Hydrocelectomy      "as a kid"; left   Family History  Problem Relation Age of Onset  . Heart attack Father   . Hypertension Father   . Alzheimer's disease Mother    History  Substance Use Topics  . Smoking status: Former Smoker    Types: Cigars    Quit date: 03/03/1998  . Smokeless tobacco: Former Neurosurgeon    Types: Chew    Quit date: 03/04/1983  . Alcohol Use: Yes     Comment: "last drink was 2011"    Review of Systems See HPI. Allergies  Penicillins  Home Medications    Current Outpatient Rx  Name  Route  Sig  Dispense  Refill  . aspirin 325 MG tablet   Oral   Take 325 mg by mouth daily.         Marland Kitchen atorvastatin (LIPITOR) 10 MG tablet      TAKE 1 TABLET DAILY AT 6PM.   90 tablet   0     Call and schedule a follow up office visit or no m ...   . B Complex Vitamins (VITAMIN B COMPLEX PO)   Oral   Take by mouth daily.         . febuxostat (ULORIC) 40 MG tablet   Oral   Take 40 mg by mouth daily.         Marland Kitchen lisinopril (PRINIVIL,ZESTRIL) 20 MG tablet   Oral   Take 1 tablet (20 mg total) by mouth daily.   90 tablet   3   . LORATADINE PO   Oral   Take by mouth as needed. 10-20 mg         . Multiple Vitamin (MULTI-VITAMIN PO)   Oral   Take by mouth daily.         . nitroGLYCERIN (NITROSTAT) 0.3 MG SL tablet   Sublingual   Place 1 tablet (0.3 mg total) under the tongue  every 5 (five) minutes as needed. For chest pain   25 tablet   11   . Omega-3 Fatty Acids (FISH OIL) 1200 MG CAPS   Oral   Take 1,200 mg by mouth 2 (two) times daily.           BP 106/66  Pulse 83  Temp(Src) 98.4 F (36.9 C) (Oral)  Resp 16  Ht 6\' 2"  (1.88 m)  Wt 220 lb (99.791 kg)  BMI 28.23 kg/m2  SpO2 99% Physical Exam  Nursing note and vitals reviewed. Constitutional:  Awake, alert, nontoxic appearance.  HENT:  Head: Atraumatic.  Eyes: Right eye exhibits no discharge. Left eye exhibits no discharge.  Neck: Neck supple.  Pulmonary/Chest: Effort normal. He exhibits no tenderness.  Abdominal: Soft. There is no tenderness. There is no rebound.  Musculoskeletal: He exhibits tenderness.  Baseline ROM, no obvious new focal weakness. Left arm and legs nontender. Right arm is no tenderness to the shoulder upper arm elbow forearm or wrist, right hand has capillary refill less than 2 seconds in all digits, right hand has light touch intact in all digits, right small finger has capillary refill less than 2 seconds and 2 point discrimination intact, right  small finger has 5 out of 5 motor strength against resistance 4 extension, FDP, and FDS testing, right small fingertip volar distal pad has 1.5 cm laceration with no foreign body noted no significant deep structure involvement noted including no bone tendon joint or neurovascular involvement noted with good hemostasis during exploration  Neurological:  Mental status and motor strength appears baseline for patient and situation.  Skin: No rash noted.  Psychiatric: He has a normal mood and affect.    ED Course   Procedures (including critical care time) LACERATION REPAIR Performed by: Hurman Horn Consent: Verbal consent obtained. Risks and benefits: risks, benefits and alternatives were discussed Patient identity confirmed: provided demographic data Time out performed prior to procedure Prepped and Draped in normal sterile fashion Wound explored Laceration Location: Right small fingertip distal volar pad Laceration Length: 1.5cm No Foreign Bodies seen or palpated Anesthesia: local infiltration digital block Local anesthetic: lidocaine 1% Anesthetic total: 4 ml Irrigation method: syringe Amount of cleaning: water under faucet Skin closure: 4-0 Nylon Number of sutures or staples: 4 sutures Technique: simple interrupted Patient tolerance: Patient tolerated the procedure well with no immediate complications. Labs Reviewed - No data to display No results found. 1. Finger laceration, initial encounter    Pt feels improved after observation and/or treatment in ED. MDM  I doubt any other EMC precluding discharge at this time including, but not necessarily limited to the following:tendon or bone involvement.  Hurman Horn, MD 09/25/12 [phone]

## 2012-10-08 ENCOUNTER — Ambulatory Visit (INDEPENDENT_AMBULATORY_CARE_PROVIDER_SITE_OTHER): Payer: BC Managed Care – PPO | Admitting: Neurology

## 2012-10-08 ENCOUNTER — Encounter: Payer: Self-pay | Admitting: Neurology

## 2012-10-08 VITALS — BP 104/65 | HR 60 | Temp 97.2°F | Ht 74.5 in | Wt 230.0 lb

## 2012-10-08 DIAGNOSIS — G4733 Obstructive sleep apnea (adult) (pediatric): Secondary | ICD-10-CM

## 2012-10-08 NOTE — Progress Notes (Signed)
Subjective:    Patient ID: Darrell Coleman is a 52 y.o. male.  HPI  Huston Foley, MD, PhD [facility] [address], [address] [address] [place], [place] [address]  Dear Dr. Timothy Lasso,  I saw your patient, Darrell Coleman upon your kind request in my neurologic clinic today for initial consultation of his sleep disorder, in particular concerning for obstructive sleep apnea. The patient is unaccompanied today. As you know, Darrell Coleman is a very pleasant 52 year old right-handed gentleman with an underlying medical history of hypertension, heart disease, BPH, gout, and diverticulosis who has been snoring and experiencing daytime somnolence. He is overweight.  His typical bedtime is reported to be around 10:00 to 10:30 PM and usual wake time is around 5:45 AM. Sleep onset typically occurs within a few minutes. He reports feeling not rested upon awakening. He wakes up on an average 2 to 3 times in the middle of the night and has to go to the bathroom 1 time on a typical night. He denies morning headaches.   He endorses feeling very tired and exhausted, but denies frank excessive daytime somnolence (EDS) and His Epworth Sleepiness Score (ESS) is 6/24 today. He has not fallen asleep while driving. The patient has not been taking a planned nap.  He has been known to snore for the past many years. Snoring is reportedly moderate, and associated with choking sounds and witnessed apneas. The patient denies a sense of choking or strangling feeling. There is no report of nighttime reflux, with no nighttime cough experienced. The patient has not noted any RLS symptoms and is not known to kick while asleep or before falling asleep. He is a restless sleeper and in the morning, the bed is quite disheveled.   He denies cataplexy, sleep paralysis, hypnagogic or hypnopompic hallucinations, or sleep attacks. He does not report any vivid dreams, nightmares, dream enactments, or parasomnias, such as sleep  talking or sleep walking. The patient has not had a sleep study or a home sleep test.  He consumes 5 or 6 caffeinated beverages per day, usually in the form of coffee and tea.   His Past Medical History Is Significant For: Past Medical History  Diagnosis Date  . Hypertension   . GERD (gastroesophageal reflux disease)   . Coronary artery disease     s/p cath 06/2011 with single vessel LAD disease; normal LV function; s/p normal Myoview testing; to manage medically  . Dyslipidemia   . Alcohol abuse, in remission     His Past Surgical History Is Significant For: Past Surgical History  Procedure Laterality Date  . Nasal sinus surgery  ~ 2006    polyp removal  . Cardiac catheterization  06/02/2011  . Hernia repair  08/2007    ventral  . Inguinal hernia repair      "as a child"; bilaterally  . Colectomy  10/2006    sigmoid  . Shoulder arthroscopy  ~ 1996    right  . Hydrocelectomy      "as a kid"; left    His Family History Is Significant For: Family History  Problem Relation Age of Onset  . Heart attack Father   . Hypertension Father   . Alzheimer's disease Mother     His Social History Is Significant For: History   Social History  . Marital Status: Married    Spouse Name: N/A    Number of Children: N/A  . Years of Education: N/A   Social History Main Topics  .  Smoking status: Former Smoker    Types: Cigars    Quit date: 03/03/1998  . Smokeless tobacco: Former Neurosurgeon    Types: Chew    Quit date: 03/04/1983  . Alcohol Use: Yes     Comment: "last drink was 2011"  . Drug Use: Yes    Special: Marijuana     Comment: "last time was ?late 1980's"  . Sexually Active: Yes   Other Topics Concern  . 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 He had problems with ETOH but quit > 1 year ago. He is married. His father died at 26 from an MI but his mother has no cardiac issues.    His Allergies Are:  Allergies  Allergen  Reactions  . Penicillins     Unknown; "I was a child"  :   His Current Medications Are:  Outpatient Encounter Prescriptions as of 10/08/2012  Medication Sig Dispense Refill  . aspirin 325 MG tablet Take 325 mg by mouth daily.      Marland Kitchen atorvastatin (LIPITOR) 10 MG tablet TAKE 1 TABLET DAILY AT 6PM.  90 tablet  0  . B Complex Vitamins (VITAMIN B COMPLEX PO) Take by mouth daily.      . febuxostat (ULORIC) 40 MG tablet Take 40 mg by mouth daily.      Marland Kitchen lisinopril (PRINIVIL,ZESTRIL) 20 MG tablet Take 1 tablet by mouth daily.      Marland Kitchen LORATADINE PO Take by mouth as needed. 10-20 mg      . Multiple Vitamin (MULTI-VITAMIN PO) Take by mouth daily.      . nitroGLYCERIN (NITROSTAT) 0.3 MG SL tablet Place 1 tablet (0.3 mg total) under the tongue every 5 (five) minutes as needed. For chest pain  25 tablet  11  . Omega-3 Fatty Acids (FISH OIL) 1200 MG CAPS Take 1,200 mg by mouth 2 (two) times daily.        No facility-administered encounter medications on file as of 10/08/2012.  : Review of Systems  Constitutional: Positive for fatigue.  Respiratory:       Snoring   Allergic/Immunologic: Positive for environmental allergies.  Neurological:       Restless leg  Psychiatric/Behavioral: Positive for sleep disturbance.    Objective:  Neurologic Exam  Physical Exam Physical Examination:   Filed Vitals:   10/08/12 0902  BP: 104/65  Pulse: 60  Temp: 97.2 F (36.2 C)    General Examination: The patient is a very pleasant 52 y.o. male in no acute distress. He appears well-developed and well-nourished and well groomed. He is overweight.   HEENT: Normocephalic, atraumatic, pupils are equal, round and reactive to light and accommodation. Funduscopic exam is normal with sharp disc margins noted. Extraocular tracking is good without limitation to gaze excursion or nystagmus noted. Normal smooth pursuit is noted. Hearing is grossly intact. Tympanic membranes are clear bilaterally. Face is symmetric with  normal facial animation and normal facial sensation. Speech is clear with no dysarthria noted. There is no hypophonia. There is no lip, neck/head, jaw or voice tremor. Neck is supple with full range of passive and active motion. There are no carotid bruits on auscultation. Oropharynx exam reveals: mild mouth dryness, good dental hygiene and moderate to significant airway crowding, due to large uvula, redundant soft palate and tonsillar size of 1-2+. Mallampati is class II. Tongue protrudes centrally and palate elevates symmetrically. Neck size is 16.5 inches.   Chest:  Clear to auscultation without wheezing, rhonchi or crackles noted.  Heart: S1+S2+0, regular and normal without murmurs, rubs or gallops noted.   Abdomen: Soft, non-tender and non-distended with normal bowel sounds appreciated on auscultation.  Extremities: There is no pitting edema in the distal lower extremities bilaterally. Pedal pulses are intact.  Skin: Warm and dry without trophic changes noted. There are no varicose veins.  Musculoskeletal: exam reveals no obvious joint deformities, tenderness or joint swelling or erythema.   Neurologically:  Mental status: The patient is awake, alert and oriented in all 4 spheres. His memory, attention, language and knowledge are appropriate. There is no aphasia, agnosia, apraxia or anomia. Speech is clear with normal prosody and enunciation. Thought process is linear. Mood is congruent and affect is normal.   Cranial nerves are as described above under HEENT exam. In addition, shoulder shrug is normal with equal shoulder height noted. Motor exam: Normal bulk, strength and tone is noted. There is no drift, tremor or rebound. Romberg is negative. Reflexes are 2+ throughout. Toes are downgoing bilaterally. Fine motor skills are intact with normal finger taps, normal hand movements, normal rapid alternating patting, normal foot taps and normal foot agility.  Cerebellar testing shows no dysmetria  or intention tremor on finger to nose testing. Heel to shin is unremarkable bilaterally. There is no truncal or gait ataxia.  Sensory exam is intact to light touch, pinprick, vibration, temperature sense and proprioception in the upper and lower extremities.  Gait, station and balance are unremarkable. No veering to one side is noted. No leaning to one side is noted. Posture is age-appropriate and stance is narrow based. No problems turning are noted. He turns en bloc. Tandem walk is unremarkable. Intact toe and heel stance is noted.               Assessment and Plan:   In summary, Darrell Coleman is a very pleasant 52 y.o.-year old male with a history and physical exam concerning for obstructive sleep apnea (OSA). I had a long chat with the patient about my findings and the diagnosis, its prognosis and treatment options. We talked about medical treatments and non-pharmacological approaches. I explained in particular the risks and ramifications of untreated moderate to severe OSA, especially with respect to developing cardiovascular disease down the Road, including congestive heart failure, difficult to treat hypertension, cardiac arrhythmias, or stroke. Even type 2 diabetes has in part been linked to untreated OSA. We talked about trying to maintain a healthy lifestyle in general, as well as the importance of weight control. I encouraged the patient to eat healthy, exercise daily and keep well hydrated, to keep a scheduled bedtime and wake time routine, to not skip any meals and eat healthy snacks in between meals.  I recommended the following at this time: sleep study with potential cpap titration. Of note, the patient is quite apprehensive about this test. He is indicating that he may not be able to tolerate CPAP. He is quite sure that he would not be able to tolerate something on his face. Therefore he if the need arises her CPAP we should try a nasal mask or nasal pillows mask. I explained the sleep  test procedure to the patient and also outlined surgical and non-surgical treatment options of OSA including the use of a dental custom-made appliance, upper airway surgery such as pillar implants, radiofrequency surgery, tongue base surgery, and UPPP. I also explained the CPAP treatment option to the patient, who indicated that he would  be willing to try CPAP if the need arises, albeit reluctantly. I explained the importance of being compliant with PAP treatment, not only for insurance purposes but primarily for the patient's long term health benefit. I answered all his questions today and the patient was in agreement. I would like to see him back after the sleep study is completed and encouraged him to call with any interim questions, concerns, problems or updates.   Thank you very much for allowing me to participate in the care of this nice patient. If I can be of any further assistance to you please do not hesitate to call me at [phone].  Sincerely,   Huston Foley, MD, PhD

## 2012-10-08 NOTE — Patient Instructions (Signed)

## 2012-11-05 ENCOUNTER — Ambulatory Visit (INDEPENDENT_AMBULATORY_CARE_PROVIDER_SITE_OTHER): Payer: BC Managed Care – PPO | Admitting: Neurology

## 2012-11-05 VITALS — Ht 74.0 in | Wt 220.0 lb

## 2012-11-05 DIAGNOSIS — G479 Sleep disorder, unspecified: Secondary | ICD-10-CM

## 2012-11-05 DIAGNOSIS — G4733 Obstructive sleep apnea (adult) (pediatric): Secondary | ICD-10-CM

## 2012-11-17 ENCOUNTER — Telehealth: Payer: Self-pay | Admitting: Neurology

## 2012-11-17 DIAGNOSIS — G4733 Obstructive sleep apnea (adult) (pediatric): Secondary | ICD-10-CM

## 2012-11-17 NOTE — Telephone Encounter (Signed)
Please call and notify the patient that the recent sleep study did confirm the diagnosis of obstructive sleep apnea and while it is overall mild, it gets severe in supine sleep. Given his co-morbidities of HTN and CAD, I believe, we should at least try treatment with CPAP and see if he feels better. I recommend treatment in the form of CPAP. This will require a repeat sleep study for proper titration and mask fitting. Please explain to patient and arrange for a CPAP titration study. I have placed an order in the chart. Thanks, Huston Foley, MD, PhD [facility])

## 2012-11-19 ENCOUNTER — Encounter: Payer: Self-pay | Admitting: *Deleted

## 2012-11-19 NOTE — Telephone Encounter (Signed)
I called and spoke with the patient's spouse who's a nurse. I informed her that her husband's sleep study did confirm the dx. Of OSA and that it was mild , but gets severe while in supine  Sleep.  I inform the spouse that Dr. Frances Furbish would like to the patient to have a CPAP titration study. Patient's spouse stated that her husband was out of town that and she would relay the message when he return on from his trip. Spouse understood and all questions were answered. Patient's spouse stated the patient is out of town next week and will call back to schedule the CPAP titration.

## 2013-01-12 ENCOUNTER — Other Ambulatory Visit: Payer: Self-pay | Admitting: Cardiology

## 2013-01-19 ENCOUNTER — Telehealth: Payer: Self-pay | Admitting: Neurology

## 2013-01-19 NOTE — Telephone Encounter (Signed)
I called and left a message for the patient to callback to the office to schedule his CPAP Titration study.

## 2013-05-14 ENCOUNTER — Other Ambulatory Visit: Payer: Self-pay | Admitting: Cardiology

## 2013-08-03 ENCOUNTER — Other Ambulatory Visit: Payer: Self-pay

## 2013-09-24 ENCOUNTER — Other Ambulatory Visit: Payer: Self-pay | Admitting: Cardiology

## 2013-09-30 NOTE — Telephone Encounter (Signed)
Patient called no answer.Left message to call needs follow up appointment with Dr.Jordan.

## 2013-10-07 NOTE — Telephone Encounter (Signed)
Patient called no answer.LMTC.Last office visit with Dr.Jordan 09/26/11.

## 2013-10-10 ENCOUNTER — Other Ambulatory Visit: Payer: Self-pay | Admitting: *Deleted

## 2013-10-10 MED ORDER — ATORVASTATIN CALCIUM 10 MG PO TABS: 10.0000 mg | ORAL_TABLET | Freq: Every day | ORAL | Status: AC

## 2013-10-10 NOTE — Telephone Encounter (Signed)
Rx was sent to pharmacy electronically. Patient needs OV

## 2014-02-09 ENCOUNTER — Encounter (HOSPITAL_COMMUNITY): Payer: Self-pay | Admitting: Cardiology

## 2014-04-18 ENCOUNTER — Ambulatory Visit (INDEPENDENT_AMBULATORY_CARE_PROVIDER_SITE_OTHER): Payer: BLUE CROSS/BLUE SHIELD | Admitting: Cardiology

## 2014-04-18 ENCOUNTER — Encounter: Payer: Self-pay | Admitting: Cardiology

## 2014-04-18 VITALS — BP 100/82 | HR 67 | Ht 74.0 in | Wt 229.1 lb

## 2014-04-18 DIAGNOSIS — I10 Essential (primary) hypertension: Secondary | ICD-10-CM

## 2014-04-18 DIAGNOSIS — R079 Chest pain, unspecified: Secondary | ICD-10-CM

## 2014-04-18 DIAGNOSIS — I2510 Atherosclerotic heart disease of native coronary artery without angina pectoris: Secondary | ICD-10-CM

## 2014-04-18 MED ORDER — NITROGLYCERIN 0.4 MG SL SUBL: 0.4000 mg | SUBLINGUAL_TABLET | SUBLINGUAL | Status: DC | PRN

## 2014-04-18 NOTE — Progress Notes (Signed)
04/18/2014 Darrell AgarMichael A Batley   09/02/1960  [id number]  Primary Physician Gwen PoundsUSSO,JOHN M, MD Primary Cardiologist: Dr. SwazilandJordan  Reason for Visit/CC: Chest Pain  HPI:  The patient is a 54 year old male, followed by Dr. SwazilandJordan, with a history of CAD, hypertension, hyperlipidemia and GERD. In April 2013 he underwent a left heart catheterization by Dr. SwazilandJordan which revealed single vessel atherosclerotic coronary disease. There was moderate to severe lesion in the ostium of the diagonal as well as a long segment of narrowing in the mid LAD which was felt to possibly represent myocardial bridging. He was noted to have normal LV function with an ejection fraction of 55-65%. It was recommended that he undergo a nuclear stress tests to assess the ischemic burden in the LAD territory. This was performed 06/11/2011. This revealed no ischemia. Medical therapy was recommended. His was last seen by Dr. SwazilandJordan July 2013. At that time he endorsed severe fatigue which was felt to be related to beta blocker therapy. He denied any significant angina and based on the lack of ischemia on prior stress testing, Dr. SwazilandJordan decided to discontinue his Metroprolol. He was continued on lisinopril for blood pressure control. He takes Lipitor for cholesterol.  He presents to clinic today for routien evaluation. Most recently he has developed some symptoms that are concerning for him. About one month ago during the snowstorm, he was outside in his driveway shoveling snow when he noted extreme exertional dyspnea and lightheadedness. He denies any signficant chest discomfort at that time, no syncope/near-syncope. However on several occasions since then he has noted some vague substernal chest discomfort. He has not noticed any particular change with exertion however he does note some improvement with subungual nitroglycerin. He also notes experiencing palpitations during exercise and decreased exercise tolerance. He denies any palpitations  at rest. He reports full medication compliance.   Current Outpatient Prescriptions  Medication Sig Dispense Refill  . aspirin 325 MG tablet Take 81 mg by mouth daily.     Marland Kitchen. atorvastatin (LIPITOR) 10 MG tablet Take 1 tablet (10 mg total) by mouth daily. <PLEASE SCHEDULE APPOINTMENT FOR REFILLS> 90 tablet 0  . B Complex Vitamins (VITAMIN B COMPLEX PO) Take by mouth daily.    . febuxostat (ULORIC) 40 MG tablet Take 40 mg by mouth daily.    Marland Kitchen. lisinopril (PRINIVIL,ZESTRIL) 20 MG tablet Take 1 tablet by mouth daily.    Marland Kitchen. LORATADINE PO Take by mouth as needed. 10-20 mg    . Multiple Vitamin (MULTI-VITAMIN PO) Take by mouth daily.    . nitroGLYCERIN (NITROSTAT) 0.4 MG SL tablet Place 1 tablet (0.4 mg total) under the tongue every 5 (five) minutes as needed. For chest pain 25 tablet 2  . Omega-3 Fatty Acids (FISH OIL) 1200 MG CAPS Take 1,200 mg by mouth 2 (two) times daily.     . tamsulosin (FLOMAX) 0.4 MG CAPS capsule Take 0.4 mg by mouth daily after supper.     No current facility-administered medications for this visit.    Allergies  Allergen Reactions  . Penicillins     Unknown; "I was a child"    History   Social History  . Marital Status: Married    Spouse Name: N/A  . Number of Children: N/A  . Years of Education: N/A   Occupational History  . Not on file.   Social History Main Topics  . Smoking status: Former Smoker    Types: Cigars    Quit date: 03/03/1998  . Smokeless  tobacco: Former Neurosurgeon    Types: Chew    Quit date: 03/04/1983  . Alcohol Use: Yes     Comment: "last drink was 2011"  . Drug Use: Yes    Special: Marijuana     Comment: "last time was ?late 1980's"  . Sexual Activity: Yes   Other Topics Concern  . Not on file   Social History Narrative   He is an Public affairs consultant. He quit smoking > 10 years ago. He had problems with ETOH but quit > 1 year ago. He is married. His father died at 58 from an MI but his mother has no cardiac issues.    Family  History  Problem Relation Age of Onset  . Heart attack Father   . Hypertension Father   . Alzheimer's disease Mother      Review of Systems: General: negative for chills, fever, night sweats or weight changes.  Cardiovascular: negative for chest pain, dyspnea on exertion, edema, orthopnea, palpitations, paroxysmal nocturnal dyspnea or shortness of breath Dermatological: negative for rash Respiratory: negative for cough or wheezing Urologic: negative for hematuria Abdominal: negative for nausea, vomiting, diarrhea, bright red blood per rectum, melena, or hematemesis Neurologic: negative for visual changes, syncope, or dizziness All other systems reviewed and are otherwise negative except as noted above.    Blood pressure 100/82, pulse 67, height  (1.88 m), weight 229 lb 1.9 oz (103.928 kg).  General appearance: alert, cooperative and no distress Neck: no carotid bruit and no JVD Lungs: clear to auscultation bilaterally Heart: regular rate and rhythm, S1, S2 normal, no murmur, click, rub or gallop Extremities: no LEE Pulses: 2+ and symmetric Skin: warm and dry Neurologic: Grossly normal  EKG NSR. 67 bpm. No ischemic abnormalities  ASSESSMENT AND PLAN:    1. CAD/Chest Pain: History of mild nonobstructive CAD on cath with evidence of no ischemia on follow-up stress test in 2013. He has had some episodes of vague chest discomfort with mixed typical/atypical features. It is not been particularly worse with exertion however he has had some relief with subligamentous nitroglycerin.  ? Stability of GERD however given his history of CAD we will repeat exercise nuclear stress testing. This will also help to evaluate palpitations that occur with exercise. Now continue medical therapy: Returning 5 mg of aspirin daily, 10 mg of Lipitor daily, 20 mg lisinopril.  2. Hypertension: Controlled and stable. Continue current medical regimen.  3. Hyperlipidemia: Continue statin therapy.  Plan:  Continue current medications. We'll arrange for an exercise nuclear stress test to assess for ischemia in the setting of known CAD and recent CP.  Dineen Kid 04/18/2014 5:56 PM

## 2014-04-18 NOTE — Patient Instructions (Signed)
Your physician recommends that you continue on your current medications as directed. Please refer to the Current Medication list given to you today.  An Rx for Nitro-glycerin has been sent to your pharmacy  Your physician has requested that you have en exercise stress myoview. For further information please visit [email or web address] Please follow instruction sheet, as given.  Your physician recommends that you schedule a follow-up appointment pending the results

## 2014-05-01 ENCOUNTER — Ambulatory Visit (HOSPITAL_COMMUNITY): Payer: BLUE CROSS/BLUE SHIELD | Attending: Cardiovascular Disease | Admitting: Radiology

## 2014-05-01 DIAGNOSIS — I2510 Atherosclerotic heart disease of native coronary artery without angina pectoris: Secondary | ICD-10-CM

## 2014-05-01 DIAGNOSIS — R079 Chest pain, unspecified: Secondary | ICD-10-CM | POA: Insufficient documentation

## 2014-05-01 MED ORDER — TECHNETIUM TC 99M SESTAMIBI GENERIC - CARDIOLITE
30.0000 | Freq: Once | INTRAVENOUS | Status: AC | PRN
  Administered 2014-05-01: 30 via INTRAVENOUS

## 2014-05-01 MED ORDER — TECHNETIUM TC 99M SESTAMIBI GENERIC - CARDIOLITE
10.0000 | Freq: Once | INTRAVENOUS | Status: AC | PRN
  Administered 2014-05-01: 10 via INTRAVENOUS

## 2014-05-01 NOTE — Progress Notes (Signed)
[facility] SITE [facility] NUCLE[facility] MED [address] [place], [place] [address] [phone]    Cardiology Nuclear Med Study  Darrell Coleman is a 54 y.o. male     MRN : [id number]     DOB: 06/17/1960  Procedure Date: 05/01/2014  Nuclear Med Background Indication for Stress Test:  Evaluation for Ischemia, and Follow-up CAD History:  CAD, MPI 2013 (normal) EF 51% Cardiac Risk Factors: Hypertension  Symptoms:  Chest Pain (last date of chest discomfort was two weeks ago), DOE and Palpitations   Nuclear Pre-Procedure Caffeine/Decaff Intake:  None> 12 hrs NPO After: 9:00pm   Lungs:  clear O2 Sat: 100% on room air. IV 0.9% NS with Angio Cath:  22g  IV Site: R Hand x 1, tolerated well IV Started by:  Irean HongPatsy Edwards, RN  Chest Size (in):  46 Cup Size: n/a  Height: 6\' 2"  (1.88 m)  Weight:  229 lb (103.874 kg)  BMI:  Body mass index is 29.39 kg/(m^2). Tech Comments:  Patient took Lisinopril this am. Irean HongPatsy Edwards, RN.    Nuclear Med Study 1 or 2 day study: 1 day  Stress Test Type:  Stress  Reading MD: N/A  Order Authorizing Provider:  Peter SwazilandJordan, MD  Resting Radionuclide: Technetium 249m Sestamibi  Resting Radionuclide Dose: 11.0 mCi   Stress Radionuclide:  Technetium 749m Sestamibi  Stress Radionuclide Dose: 33.0 mCi           Stress Protocol Rest HR: 60 Stress HR: 150  Rest BP: 118/76 Stress BP: 154/71  Exercise Time (min): 11:00 METS: 11.7   Predicted Max HR: 166 bpm % Max HR: 90.36 bpm Rate Pressure Product: [id number]   Dose of Adenosine (mg):  n/a Dose of Lexiscan: n/a mg  Dose of Atropine (mg): n/a Dose of Dobutamine: n/a mcg/kg/min (at max HR)  Stress Test Technologist: Nelson ChimesSharon Brooks, BS-ES  Nuclear Technologist:  Kerby NoraElzbieta Kubak, CNMT     Rest Procedure:  Myocardial perfusion imaging was performed at rest 45 minutes following the intravenous administration of Technetium 129m Sestamibi. Rest ECG: NSR - Normal EKG  Stress Procedure:  The patient exercised on the treadmill  utilizing the Bruce Protocol for 11:00 minutes. The patient stopped due to fatigue and denied any chest pain.  Technetium 249m Sestamibi was injected at peak exercise and myocardial perfusion imaging was performed after a brief delay. Stress ECG: No significant change from baseline ECG  QPS Raw Data Images:  Normal; no motion artifact; normal heart/lung ratio. Stress Images:  Small, mild basal to mid inferolateral perfusion defect. Rest Images:  Small, mild basal inferolateral perfusion defect. Subtraction (SDS):  Partially reversible inferolateral perfusion defect. Transient Ischemic Dilatation (Normal <1.22):  0.99 Lung/Heart Ratio (Normal <0.45):  0.36  Quantitative Gated Spect Images QGS EDV:  126 ml QGS ESV:  64 ml  Impression Exercise Capacity:  Excellent exercise capacity. BP Response:  Normal blood pressure response. Clinical Symptoms:  Fatigue, no chest pain. ECG Impression:  No significant ST segment change suggestive of ischemia. Comparison with Prior Nuclear Study: No images to compare  Overall Impression:  Intermediate risk stress nuclear study with a small, mild partially reversible basal to mid inferolateral perfusion defect.  This may represent a small area of ischemia but cannot rule out artifact.  EF 49% with global hypokinesis.  Study rated intermediate risk because of low EF. .  LV Ejection Fraction: 49%.  LV Wall Motion:  Diffuse hypokinesis.    Marca AnconaDalton Joachim Coleman 05/01/2014

## 2014-05-03 ENCOUNTER — Other Ambulatory Visit: Payer: Self-pay

## 2014-05-03 DIAGNOSIS — I2510 Atherosclerotic heart disease of native coronary artery without angina pectoris: Secondary | ICD-10-CM

## 2014-05-04 ENCOUNTER — Ambulatory Visit (HOSPITAL_COMMUNITY)
Admission: RE | Admit: 2014-05-04 | Discharge: 2014-05-04 | Disposition: A | Discharge status: Home / Self Care | Payer: BLUE CROSS/BLUE SHIELD | Source: Ambulatory Visit | Attending: Cardiology | Admitting: Cardiology

## 2014-05-04 DIAGNOSIS — I2510 Atherosclerotic heart disease of native coronary artery without angina pectoris: Secondary | ICD-10-CM | POA: Diagnosis not present

## 2014-05-04 DIAGNOSIS — R079 Chest pain, unspecified: Secondary | ICD-10-CM

## 2014-05-04 NOTE — Progress Notes (Signed)
2D Echo Performed 05/04/2014    Kendle Erker, RCS  

## 2015-11-29 ENCOUNTER — Other Ambulatory Visit (HOSPITAL_COMMUNITY): Payer: Self-pay | Admitting: Internal Medicine

## 2015-11-29 ENCOUNTER — Ambulatory Visit (HOSPITAL_COMMUNITY)
Admission: RE | Admit: 2015-11-29 | Discharge: 2015-11-29 | Disposition: A | Discharge status: Home / Self Care | Payer: BLUE CROSS/BLUE SHIELD | Source: Ambulatory Visit | Attending: Vascular Surgery | Admitting: Vascular Surgery

## 2015-11-29 DIAGNOSIS — I82532 Chronic embolism and thrombosis of left popliteal vein: Secondary | ICD-10-CM | POA: Diagnosis not present

## 2015-11-29 DIAGNOSIS — I82512 Chronic embolism and thrombosis of left femoral vein: Secondary | ICD-10-CM | POA: Insufficient documentation

## 2015-11-29 DIAGNOSIS — R600 Localized edema: Secondary | ICD-10-CM

## 2016-10-16 ENCOUNTER — Encounter: Payer: Self-pay | Admitting: Physician Assistant

## 2016-10-16 ENCOUNTER — Ambulatory Visit (INDEPENDENT_AMBULATORY_CARE_PROVIDER_SITE_OTHER): Payer: BLUE CROSS/BLUE SHIELD | Admitting: Physician Assistant

## 2016-10-16 VITALS — BP 112/74 | HR 62 | Ht 74.0 in | Wt 224.8 lb

## 2016-10-16 DIAGNOSIS — I25119 Atherosclerotic heart disease of native coronary artery with unspecified angina pectoris: Secondary | ICD-10-CM | POA: Diagnosis not present

## 2016-10-16 DIAGNOSIS — R0789 Other chest pain: Secondary | ICD-10-CM

## 2016-10-16 DIAGNOSIS — I10 Essential (primary) hypertension: Secondary | ICD-10-CM | POA: Diagnosis not present

## 2016-10-16 DIAGNOSIS — E785 Hyperlipidemia, unspecified: Secondary | ICD-10-CM

## 2016-10-16 MED ORDER — NITROGLYCERIN 0.4 MG SL SUBL: 0.4000 mg | SUBLINGUAL_TABLET | SUBLINGUAL | 2 refills | Status: AC | PRN

## 2016-10-16 NOTE — Progress Notes (Addendum)
Cardiology Office Note    Date:  10/18/2016   ID:  Darrell WILLHITE, DOB June 16, 1960, MRN [id number]  PCP:  [name], MD  Cardiologist:  Dr. Swaziland   Chief Complaint  Patient presents with  . Follow-up    seen for Dr. Swaziland    History of Present Illness:  Darrell Coleman is a 56 y.o. male with PMH of PMH of HTN, HLD, GERD and CAD. In April 2013, he had left heart cath by Dr. Swaziland which revealed single-vessel coronary artery disease involving moderate to severe lesion at in the ostium of diagonal as well as long segment of narrowing in the mid LAD which was felt to possibly present myocardial bridging. He was noted to have normal LV function with ejection fraction of 55-65%. It was recommended that he undergo nuclear stress test to assess the ischemic burden in the LAD territory. This was performed on 06/11/2011 which revealed no ischemia. Medical therapy was recommended. He had a history of fatigue on beta blocker. Last Myoview obtained on 05/02/2014 showed low risk study with small basal inferolateral defect mostly fixed, EF 49% borderline. Last echocardiogram obtained on 05/04/2014 showed EF 55-60%, grade 1 diastolic dysfunction.   He has been doing well from cardiology perspective. If he moved to fast sometimes he may have a lot of fatigue. His blood pressure has been is a borderline range. Otherwise he denies any shortness breath, lower extremity edema, orthopnea or PND. I'm fine with reducing the lisinopril to 10 mg daily, however if his systolic blood pressure goes into the 140s range, I am in favor of restarting the 20 mg daily. He does occasionally have very atypical chest discomfort, then not related to exertion and very short acting. I would not do any further workup at this time.   Past Medical History:  Diagnosis Date  . Alcohol abuse, in remission   . Coronary artery disease    s/p cath 06/2011 with single vessel LAD disease; normal LV function; s/p normal Myoview testing; to  manage medically  . Dyslipidemia   . GERD (gastroesophageal reflux disease)   . Hypertension     Past Surgical History:  Procedure Laterality Date  . CARDIAC CATHETERIZATION  06/02/2011  . COLECTOMY  10/2006   sigmoid  . HERNIA REPAIR  08/2007   ventral  . hydrocelectomy     "as a kid"; left  . INGUINAL HERNIA REPAIR     "as a child"; bilaterally  . LEFT HEART CATHETERIZATION WITH CORONARY ANGIOGRAM N/A 06/02/2011   Procedure: LEFT HEART CATHETERIZATION WITH CORONARY ANGIOGRAM;  Surgeon: [name], MD;  Location: [facility] CATH LAB;  Service: Cardiovascular;  Laterality: N/A;  . NASAL SINUS SURGERY  ~ 2006   polyp removal  . SHOULDER ARTHROSCOPY  ~ 1996   right    Current Medications: Outpatient Medications Prior to Visit  Medication Sig Dispense Refill  . aspirin 325 MG tablet Take 81 mg by mouth daily.     Marland Kitchen atorvastatin (LIPITOR) 10 MG tablet Take 1 tablet (10 mg total) by mouth daily. <PLEASE SCHEDULE APPOINTMENT FOR REFILLS> 90 tablet 0  . B Complex Vitamins (VITAMIN B COMPLEX PO) Take by mouth daily.    Marland Kitchen lisinopril (PRINIVIL,ZESTRIL) 20 MG tablet Take 0.5 tablets by mouth daily. 1/2 TABLET DAILY (10MG )    . LORATADINE PO Take by mouth as needed. 10-20 mg    . Multiple Vitamin (MULTI-VITAMIN PO) Take by mouth daily.    . Omega-3 Fatty Acids (FISH  OIL) 1200 MG CAPS Take 1,200 mg by mouth 2 (two) times daily.     . tamsulosin (FLOMAX) 0.4 MG CAPS capsule Take 0.4 mg by mouth daily after supper.    . febuxostat (ULORIC) 40 MG tablet Take 40 mg by mouth daily.    . nitroGLYCERIN (NITROSTAT) 0.4 MG SL tablet Place 1 tablet (0.4 mg total) under the tongue every 5 (five) minutes as needed. For chest pain 25 tablet 2   No facility-administered medications prior to visit.      Allergies:   Penicillins   Social History   Social History  . Marital status: Married    Spouse name: N/A  . Number of children: N/A  . Years of education: N/A   Social History Main Topics  . Smoking  status: Former Smoker    Types: Cigars    Quit date: 03/03/1998  . Smokeless tobacco: Former Neurosurgeon    Types: Chew    Quit date: 03/04/1983  . Alcohol use Yes     Comment: "last drink was 2011"  . Drug use: Yes    Types: Marijuana     Comment: "last time was ?late 1980's"  . Sexual activity: Yes   Other Topics Concern  . 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 He had problems with ETOH but quit > 1 year ago. He is married. His father died at 36 from an MI but his mother has no cardiac issues.     Family History:  The patient's family history includes Alzheimer's disease in his mother; Heart attack in his father; Hypertension in his father.   ROS:   Please see the history of present illness.    ROS All other systems reviewed and are negative.   PHYSICAL EXAM:   VS:  BP 112/74   Pulse 62   Ht 6\' 2"  (1.88 m)   Wt 224 lb 12.8 oz (102 kg)   BMI 28.86 kg/m    GEN: Well nourished, well developed, in no acute distress  HEENT: normal  Neck: no JVD, carotid bruits, or masses Cardiac: RRR; no murmurs, rubs, or gallops,no edema  Respiratory:  clear to auscultation bilaterally, normal work of breathing GI: soft, nontender, nondistended, + BS MS: no deformity or atrophy  Skin: warm and dry, no rash Neuro:  Alert and Oriented x 3, Strength and sensation are intact Psych: euthymic mood, full affect  Wt Readings from Last 3 Encounters:  10/16/16 224 lb 12.8 oz (102 kg)  05/01/14 229 lb (103.9 kg)  04/18/14 229 lb 1.9 oz (103.9 kg)      Studies/Labs Reviewed:   EKG:  EKG is ordered today.  The ekg ordered today demonstrates Normal sinus rhythm, no obvious ST-T wave changes  Recent Labs: No results found for requested labs within last 8760 hours.   Lipid Panel    Component Value Date/Time   CHOL 88 07/25/2011 0830   TRIG 93 07/25/2011 0830   HDL 37 (L) 07/25/2011 0830   CHOLHDL 3.6 06/03/2011 0400   VLDL 37 06/03/2011  0400   LDLCALC 32 07/25/2011 0830    Additional studies/ records that were reviewed today include:   Echo 05/04/2014 LV EF: 55% -  60%  Study Conclusions  - Left ventricle: The cavity size was normal. Wall thickness was normal. Systolic function was normal. The estimated ejection fraction was in the range of 55% to 60%. Wall motion was normal; there  were no regional wall motion abnormalities. Doppler parameters are consistent with abnormal left ventricular relaxation (grade 1 diastolic dysfunction). The E/e&' ratio is between 8-15, suggesting normal LV filling pressure. - Mitral valve: Mildly thickened leaflets . There was trivial regurgitation. - Left atrium: The atrium was normal in size. - Tricuspid valve: There was trivial regurgitation. - Pulmonary arteries: PA peak pressure: 20 mm Hg (S). - Inferior vena cava: The vessel was normal in size. The respirophasic diameter changes were in the normal range (= 50%), consistent with normal central venous pressure.  Impressions:  - Compared to a prior echo in 2013, the EF has improved to 55-60%, normal wall motion, however, diastolic dysfunction is now present and there is indeterminate LV filling pressure.    Myoview 05/01/2014 Impression Exercise Capacity:  Excellent exercise capacity. BP Response:  Normal blood pressure response. Clinical Symptoms:  Fatigue, no chest pain. ECG Impression:  No significant ST segment change suggestive of ischemia. Comparison with Prior Nuclear Study: No images to compare  Overall Impression:  Intermediate risk stress nuclear study with a small, mild partially reversible basal to mid inferolateral perfusion defect.  This may represent a small area of ischemia but cannot rule out artifact.  EF 49% with global hypokinesis.  Study rated intermediate risk because of low EF. .  LV Ejection Fraction: 49%.  LV Wall Motion:  Diffuse hypokinesis.    ASSESSMENT:    1. Coronary  artery disease involving native coronary artery of native heart with angina pectoris (HCC)   2. Essential hypertension   3. Atypical chest pain   4. Hyperlipidemia, unspecified hyperlipidemia type      PLAN:  In order of problems listed above:  1. CAD: Occasionally has very atypical chest discomfort, not related to exertion, very transient. Outpatient stress test, if negative no further workup.  2. Hypertension: Occasionally have dizziness, blood pressure borderline, I would decrease lisinopril to 10 mg daily as a trial, however if his blood pressure trended up to greater than 140, then I would urge him to restart at 20 mg daily.  3. Hyperlipidemia: On Lipitor 10 mg daily, monitored by primary care provider    Medication Adjustments/Labs and Tests Ordered: Current medicines are reviewed at length with the patient today.  Concerns regarding medicines are outlined above.  Medication changes, Labs and Tests ordered today are listed in the Patient Instructions below. Patient Instructions  Medication Instructions:  DECREASE LISINOPRIL 10MG ; AFTER IF BP IS TRENDING >140 MAY INCREASE TO 20MG  If you need a refill on your cardiac medications before your next appointment, please call your pharmacy.  Testing/Procedures: Your physician has requested that you have a exercise myoview. For further information please visit [email or web address] Please follow instruction sheet, as given.  Follow-Up: Your physician wants you to follow-up in: 12 MONTHS WITH DR SwazilandJORDAN You should receive a reminder letter in the mail two months in advance. If you do not receive a letter, please call our office JUN 2019 to schedule the AUGUST 2019 follow-up appointment.   Thank you for choosing [facility]!!         Signed, Bonny Egger, [place]  10/18/2016 11:12 AM    [facility] [address], [place], [place]  [address] Phone: [phone]) [phone]; Fax: [phone]

## 2016-10-16 NOTE — Patient Instructions (Addendum)
Medication Instructions:  DECREASE LISINOPRIL 10MG ; AFTER IF BP IS TRENDING >140 MAY INCREASE TO 20MG  If you need a refill on your cardiac medications before your next appointment, please call your pharmacy.  Testing/Procedures: Your physician has requested that you have a exercise myoview. For further information please visit [email or web address]. Please follow instruction sheet, as given.  Follow-Up: Your physician wants you to follow-up in: 12 MONTHS WITH DR SwazilandJORDAN You should receive a reminder letter in the mail two months in advance. If you do not receive a letter, please call our office JUN 2019 to schedule the AUGUST 2019 follow-up appointment.   Thank you for choosing [facility]

## 2016-10-18 ENCOUNTER — Encounter: Payer: Self-pay | Admitting: Physician Assistant

## 2016-10-21 ENCOUNTER — Telehealth (HOSPITAL_COMMUNITY): Payer: Self-pay

## 2016-10-21 NOTE — Telephone Encounter (Signed)
Encounter complete. 

## 2016-10-22 ENCOUNTER — Telehealth (HOSPITAL_COMMUNITY): Payer: Self-pay

## 2016-10-22 NOTE — Telephone Encounter (Signed)
Encounter complete. 

## 2016-10-23 ENCOUNTER — Ambulatory Visit (HOSPITAL_COMMUNITY)
Admission: RE | Admit: 2016-10-23 | Discharge: 2016-10-23 | Disposition: A | Discharge status: Home / Self Care | Payer: BLUE CROSS/BLUE SHIELD | Source: Ambulatory Visit | Attending: Cardiology | Admitting: Cardiology

## 2016-10-23 ENCOUNTER — Telehealth (HOSPITAL_COMMUNITY): Payer: Self-pay

## 2016-10-23 DIAGNOSIS — I25119 Atherosclerotic heart disease of native coronary artery with unspecified angina pectoris: Secondary | ICD-10-CM

## 2016-10-23 DIAGNOSIS — R0789 Other chest pain: Secondary | ICD-10-CM

## 2016-10-23 DIAGNOSIS — I10 Essential (primary) hypertension: Secondary | ICD-10-CM

## 2016-10-23 NOTE — Telephone Encounter (Signed)
Encounter complete. 

## 2016-10-24 ENCOUNTER — Ambulatory Visit (HOSPITAL_COMMUNITY)
Admission: RE | Admit: 2016-10-24 | Discharge: 2016-10-24 | Disposition: A | Discharge status: Home / Self Care | Payer: BLUE CROSS/BLUE SHIELD | Source: Ambulatory Visit | Attending: Internal Medicine | Admitting: Internal Medicine

## 2016-10-24 DIAGNOSIS — R0789 Other chest pain: Secondary | ICD-10-CM | POA: Insufficient documentation

## 2016-10-24 DIAGNOSIS — I10 Essential (primary) hypertension: Secondary | ICD-10-CM | POA: Diagnosis present

## 2016-10-24 DIAGNOSIS — I25119 Atherosclerotic heart disease of native coronary artery with unspecified angina pectoris: Secondary | ICD-10-CM | POA: Insufficient documentation

## 2016-10-24 LAB — MYOCARDIAL PERFUSION IMAGING
CHL CUP MPHR: 164 {beats}/min
CHL CUP NUCLEAR SDS: 0
CHL CUP NUCLEAR SRS: 3
CHL CUP NUCLEAR SSS: 3
CHL CUP RESTING HR STRESS: 56 {beats}/min
CSEPED: 11 min
CSEPEW: 13.7 METS
CSEPHR: 90 %
Exercise duration (sec): 41 s
LV dias vol: 126 mL (ref 62–150)
LV sys vol: 67 mL
NUC STRESS TID: 1.09
Peak HR: 148 {beats}/min
RPE: 17

## 2016-10-24 MED ORDER — TECHNETIUM TC 99M TETROFOSMIN IV KIT
9.5000 | PACK | Freq: Once | INTRAVENOUS | Status: AC | PRN
  Administered 2016-10-24: 9.5 via INTRAVENOUS
  Filled 2016-10-24: qty 10

## 2016-10-24 MED ORDER — TECHNETIUM TC 99M TETROFOSMIN IV KIT
31.3000 | PACK | Freq: Once | INTRAVENOUS | Status: AC | PRN
  Administered 2016-10-24: 31.3 via INTRAVENOUS
  Filled 2016-10-24: qty 32

## 2016-12-05 ENCOUNTER — Other Ambulatory Visit (HOSPITAL_COMMUNITY): Payer: Self-pay | Admitting: Internal Medicine

## 2016-12-05 DIAGNOSIS — I824Y2 Acute embolism and thrombosis of unspecified deep veins of left proximal lower extremity: Secondary | ICD-10-CM

## 2016-12-08 ENCOUNTER — Ambulatory Visit (HOSPITAL_COMMUNITY)
Admission: RE | Admit: 2016-12-08 | Discharge: 2016-12-08 | Disposition: A | Discharge status: Home / Self Care | Payer: BLUE CROSS/BLUE SHIELD | Source: Ambulatory Visit | Attending: Vascular Surgery | Admitting: Vascular Surgery

## 2016-12-08 DIAGNOSIS — I8290 Acute embolism and thrombosis of unspecified vein: Secondary | ICD-10-CM | POA: Insufficient documentation

## 2016-12-08 DIAGNOSIS — I824Y2 Acute embolism and thrombosis of unspecified deep veins of left proximal lower extremity: Secondary | ICD-10-CM

## 2017-02-26 DIAGNOSIS — M9903 Segmental and somatic dysfunction of lumbar region: Secondary | ICD-10-CM | POA: Diagnosis not present

## 2017-02-26 DIAGNOSIS — M9905 Segmental and somatic dysfunction of pelvic region: Secondary | ICD-10-CM | POA: Diagnosis not present

## 2017-02-26 DIAGNOSIS — M9902 Segmental and somatic dysfunction of thoracic region: Secondary | ICD-10-CM | POA: Diagnosis not present

## 2017-02-26 DIAGNOSIS — M9901 Segmental and somatic dysfunction of cervical region: Secondary | ICD-10-CM | POA: Diagnosis not present

## 2017-03-05 ENCOUNTER — Ambulatory Visit (INDEPENDENT_AMBULATORY_CARE_PROVIDER_SITE_OTHER): Payer: BLUE CROSS/BLUE SHIELD | Admitting: Neurology

## 2017-03-05 ENCOUNTER — Encounter: Payer: Self-pay | Admitting: Neurology

## 2017-03-05 VITALS — BP 138/74 | HR 80 | Ht 74.0 in | Wt 227.0 lb

## 2017-03-05 DIAGNOSIS — G4733 Obstructive sleep apnea (adult) (pediatric): Secondary | ICD-10-CM

## 2017-03-05 NOTE — Patient Instructions (Addendum)
We will set you up at home with a so called autoPAP machine for treatment of your overall mild obstructive sleep apnea.  You can call us or email us for a DME company of your choosing.  I will see you back in 3 months for a recheck. We may consider a full night CPAP titration study in the future, if needed.

## 2017-03-05 NOTE — Progress Notes (Signed)
Subjective:    Patient ID: Darrell Coleman is a 57 y.o. male.  HPI     Huston Foley, MD, PhD [facility] [address], [address] [address] [place], [place] [address]  Dear Dr. Timothy Lasso,   I saw your patient, Darrell Coleman upon your kind request in my neurologic clinic today for re-consultation of his sleep disorder, in particular concerning for obstructive sleep apnea. The patient is unaccompanied today. As you know, Darrell Coleman is a very pleasant 57 year old right-handed gentleman with an underlying medical history of hypertension, heart disease reported as single-vessel coronary artery disease, prior history of alcohol abuse in remission currently, osteoarthritis, deep venous, neck pain, BPH, gout, DVT, overweight state, and diverticulosis who reports snoring and excessive daytime somnolence. I reviewed your office note from 03/10/2016, which you kindly included. I have previously seen him once nearly 4-1/2 years ago for sleep evaluation. He had a sleep study on 11/05/2012 which showed an overall AHI of 9.8 per hour, supine AHI of 47.5 per hour, REM AHI of 10.1 per hour. O2 nadir was 89%, average oxygen saturation was 94%. He had no significant PLMS. He was advised to return for a CPAP titration study to treat his sleep apnea. However, he was lost to follow-up after that. He reports difficulty maintaining sleep and poor sleep quality. Since his sleep study in September 2014 he has lost a little bit of weight. He has neck pain and sees a Land. He has a BT of around 11 PM and WT is 6:20 AM. He finished taking the Xarelto recently, after taking it for about 15 months. He travels a lot for work. He is a non-smoker and drinks caffeine in limitations, only in the mornings. He has not had any alcohol in 7 years and is motivated to continue to stay off of it completely. He has a family history of alcoholism as well. His main sleep-related complaints is not waking up rested and feeling  tired during the day. His father use to snore quite heavily.   Previously:   10/08/2012: He has been snoring and experiencing daytime somnolence. He is overweight.   His typical bedtime is reported to be around 10:00 to 10:30 PM and usual wake time is around 5:45 AM. Sleep onset typically occurs within a few minutes. He reports feeling not rested upon awakening. He wakes up on an average 2 to 3 times in the middle of the night and has to go to the bathroom 1 time on a typical night. He denies morning headaches.   He endorses feeling very tired and exhausted, but denies frank excessive daytime somnolence (EDS) and His Epworth Sleepiness Score (ESS) is 6/24 today. He has not fallen asleep while driving. The patient has not been taking a planned nap.  He has been known to snore for the past many years. Snoring is reportedly moderate, and associated with choking sounds and witnessed apneas. The patient denies a sense of choking or strangling feeling. There is no report of nighttime reflux, with no nighttime cough experienced. The patient has not noted any RLS symptoms and is not known to kick while asleep or before falling asleep. He is a restless sleeper and in the morning, the bed is quite disheveled.   He denies cataplexy, sleep paralysis, hypnagogic or hypnopompic hallucinations, or sleep attacks. He does not report any vivid dreams, nightmares, dream enactments, or parasomnias, such as sleep talking or sleep walking. The patient has not had a sleep study or a  home sleep test.  He consumes 5 or 6 caffeinated beverages per day, usually in the form of coffee and tea.   His Past Medical History Is Significant For: Past Medical History:  Diagnosis Date  . Alcohol abuse, in remission   . Coronary artery disease    s/p cath 06/2011 with single vessel LAD disease; normal LV function; s/p normal Myoview testing; to manage medically  . Dyslipidemia   . GERD (gastroesophageal reflux disease)   . Hypertension      His Past Surgical History Is Significant For: Past Surgical History:  Procedure Laterality Date  . CARDIAC CATHETERIZATION  06/02/2011  . COLECTOMY  10/2006   sigmoid  . HERNIA REPAIR  08/2007   ventral  . hydrocelectomy     "as a kid"; left  . INGUINAL HERNIA REPAIR     "as a child"; bilaterally  . LEFT HEART CATHETERIZATION WITH CORONARY ANGIOGRAM N/A 06/02/2011   Procedure: LEFT HEART CATHETERIZATION WITH CORONARY ANGIOGRAM;  Surgeon: Peter M SwazilandJordan, MD;  [facility];  Service: Cardiovascular;  Laterality: N/A;  . NASAL SINUS SURGERY  ~ 2006   polyp removal  . SHOULDER ARTHROSCOPY  ~ 1996   right    His Family History Is Significant For: Family History  Problem Relation Age of Onset  . Heart attack Father   . Hypertension Father   . Alzheimer's disease Mother     His Social History Is Significant For: Social History   Socioeconomic History  . Marital status: Married    Spouse name: None  . Number of children: None  . Years of education: None  . Highest education level: None  Social Needs  . Financial resource strain: None  . Food insecurity - worry: None  . Food insecurity - inability: None  . Transportation needs - medical: None  . Transportation needs - non-medical: None  Occupational History  . None  Tobacco Use  . Smoking status: Former Smoker    Types: Cigars    Last attempt to quit: 03/03/1998    Years since quitting: 19.0  . Smokeless tobacco: Former NeurosurgeonUser    Types: Chew    Quit date: 03/04/1983  Substance and Sexual Activity  . Alcohol use: Yes    Comment: "last drink was 2011"  . Drug use: Yes    Types: Marijuana    Comment: "last time was ?late 1980's"  . Sexual activity: Yes  Other Topics Concern  . None  Social History Narrative   He is an Public affairs consultantindustrial engineer. He quit smoking > 10 years ago. He had problems with ETOH but quit > 1 year ago. He is married. His father died at 7857 from an MI but his mother has no cardiac issues.    His  Allergies Are:  Allergies  Allergen Reactions  . Penicillins     Unknown; "I was a child"  :   His Current Medications Are:  Outpatient Encounter Medications as of 03/05/2017  Medication Sig  . allopurinol (ZYLOPRIM) 300 MG tablet Take 1 tablet by mouth daily.  Marland Kitchen. aspirin 325 MG tablet Take 81 mg by mouth daily.   Marland Kitchen. atorvastatin (LIPITOR) 10 MG tablet Take 1 tablet (10 mg total) by mouth daily. <PLEASE SCHEDULE APPOINTMENT FOR REFILLS>  . B Complex Vitamins (VITAMIN B COMPLEX PO) Take by mouth daily.  Marland Kitchen. lisinopril (PRINIVIL,ZESTRIL) 20 MG tablet Take 0.5 tablets by mouth daily. 1/2 TABLET DAILY (10MG )  . LORATADINE PO Take by mouth as needed. 10-20 mg  .  Multiple Vitamin (MULTI-VITAMIN PO) Take by mouth daily.  . nitroGLYCERIN (NITROSTAT) 0.4 MG SL tablet Place 1 tablet (0.4 mg total) under the tongue every 5 (five) minutes as needed. For chest pain  . Omega-3 Fatty Acids (FISH OIL) 1200 MG CAPS Take 1,200 mg by mouth 2 (two) times daily.   . tamsulosin (FLOMAX) 0.4 MG CAPS capsule Take 0.4 mg by mouth daily after supper.  . [DISCONTINUED] XARELTO 20 MG TABS tablet Take 1 tablet by mouth daily.   No facility-administered encounter medications on file as of 03/05/2017.   :  Review of Systems:  Out of a complete 14 point review of systems, all are reviewed and negative with the exception of these symptoms as listed below: Review of Systems  Neurological:       Pt presents today to discuss getting a cpap titration study. Pt does not understand why the cpap was not initiated during his last sleep study.    Objective:  Neurological Exam  Physical Exam Physical Examination:   Vitals:   03/05/17 0908  BP: 138/74  Pulse: 80    General Examination: The patient is a very pleasant 57 y.o. male in no acute distress. He appears well-developed and well-nourished and well groomed.   HEENT: Normocephalic, atraumatic, pupils are equal, round and reactive to light and accommodation.  Extraocular tracking is good without limitation to gaze excursion or nystagmus noted. Normal smooth pursuit is noted. Hearing is grossly intact. Face is symmetric with normal facial animation and normal facial sensation. Speech is clear with no dysarthria noted. There is no hypophonia. There is no lip, neck/head, jaw or voice tremor. Neck is supple with full range of passive and active motion. There are no carotid bruits on auscultation. Oropharynx exam reveals: mild mouth dryness, good dental hygiene and moderate airway crowding, due to smaller airway entry, larger uvula and redundant soft palate,  lower reaching uvula, tonsillar size of 1 to 2+. Mallampati is class II. Tongue protrudes centrally and palate elevates symmetrically. Neck size is 16.25 inches. He does not have an overbite.  Chest: Clear to auscultation without wheezing, rhonchi or crackles noted.  Heart: S1+S2+0, regular and normal without murmurs, rubs or gallops noted.   Abdomen: Soft, non-tender and non-distended with normal bowel sounds appreciated on auscultation.  Extremities: There is no pitting edema in the distal lower extremities bilaterally.   Skin: Warm and dry without trophic changes noted. There are  mild varicose veins left distal leg, around shin area.  Musculoskeletal: exam reveals no obvious joint deformities, tenderness or joint swelling or erythema.   Neurologically:  Mental status: The patient is awake, alert and oriented in all 4 spheres. His memory, attention, language and knowledge are appropriate. There is no aphasia, agnosia, apraxia or anomia. Speech is clear with normal prosody and enunciation. Thought process is linear. Mood is congruent and affect is normal.   Cranial nerves are as described above under HEENT exam. In addition, shoulder shrug is normal with equal shoulder height noted. Motor exam: Normal bulk, strength and tone is noted. There is no drift, tremor or rebound. Romberg is negative.  Reflexes are 1- 2+ throughout. Fine motor skills are grossly intact.  Cerebellar testing shows no dysmetria or intention tremor on finger to nose testing. Heel to shin is unremarkable bilaterally.  Sensory exam is intact to light touch in the upper and lower extremities.  Gait, station and balance are unremarkable. No veering to one side is noted. No leaning to one side is  noted. Posture is age-appropriate and stance is narrow based. No problems turning are noted. Tandem walk is unremarkable.   Assessment and Plan:   In summary, ELBA DENDINGER is a very pleasant 57 year old male with an underlying medical history of hypertension, heart disease reported as single-vessel coronary artery disease, prior history of alcohol abuse in remission currently, osteoarthritis, deep venous, neck pain, BPH, gout, DVT, overweight state, and diverticulosis who presents for evaluation of his obstructive sleep apnea. Sleep study over 4 years ago showed overall mild sleep apnea, severe during supine sleep. Oxygen desaturation nadir was 89%. Nevertheless, he has symptoms with regards to his sleep including poorly consolidated sleep, nonrestorative sleep and daytime tiredness. He is a candidate for sleep apnea treatment for that reason. I suggested AutoPap therapy for his overall mild sleep apnea. In the future, we may consider bringing him in for a full night CPAP titration study if we need to optimize treatment. He is agreeable to trying a nose mask or nasal pillows interface. He will call us with his choice of DME company. I suggested a 3 month follow-up, sooner as needed. We talked about his sleep study results from 2014. He may or may not be a candidate for an oral appliance as an alternative treatment. His weight has been fluctuating. He is overweight but not obese.  I answered all his questions today and the patient was in agreement. I encouraged him to call with any interim questions, concerns, problems or updates.    Thank you very much for allowing me to participate in the care of this nice patient. If I can be of any further assistance to you please do not hesitate to call me at [phone].  Sincerely,   Huston Foley, MD, PhD

## 2017-03-06 ENCOUNTER — Telehealth: Payer: Self-pay | Admitting: Neurology

## 2017-03-06 DIAGNOSIS — M109 Gout, unspecified: Secondary | ICD-10-CM | POA: Diagnosis not present

## 2017-03-06 DIAGNOSIS — I10 Essential (primary) hypertension: Secondary | ICD-10-CM | POA: Diagnosis not present

## 2017-03-06 DIAGNOSIS — R82998 Other abnormal findings in urine: Secondary | ICD-10-CM | POA: Diagnosis not present

## 2017-03-06 DIAGNOSIS — R7309 Other abnormal glucose: Secondary | ICD-10-CM | POA: Diagnosis not present

## 2017-03-06 DIAGNOSIS — Z0000 Encounter for general adult medical examination without abnormal findings: Secondary | ICD-10-CM | POA: Diagnosis not present

## 2017-03-06 DIAGNOSIS — Z125 Encounter for screening for malignant neoplasm of prostate: Secondary | ICD-10-CM | POA: Diagnosis not present

## 2017-03-06 NOTE — Telephone Encounter (Signed)
Please send autoPAP order from yesterday to [facility] of choice, which [facility] (get [facility]?).  Pt should have FU appt pending in about 3 mo.

## 2017-03-06 NOTE — Telephone Encounter (Signed)
Pt called back he is wanting to use [facility] (p) [phone]  (f) [phone] as DME, he spoke with Jasmine DecemberSharon.  FYI

## 2017-03-09 NOTE — Telephone Encounter (Signed)
DME referral sent to [facility]. Pt has an appt on 06/08/17 with Dr. Frances FurbishAthar.

## 2017-03-13 DIAGNOSIS — G4733 Obstructive sleep apnea (adult) (pediatric): Secondary | ICD-10-CM | POA: Diagnosis not present

## 2017-03-13 DIAGNOSIS — Z86718 Personal history of other venous thrombosis and embolism: Secondary | ICD-10-CM | POA: Diagnosis not present

## 2017-03-13 DIAGNOSIS — R7309 Other abnormal glucose: Secondary | ICD-10-CM | POA: Diagnosis not present

## 2017-03-13 DIAGNOSIS — Z1389 Encounter for screening for other disorder: Secondary | ICD-10-CM | POA: Diagnosis not present

## 2017-03-13 DIAGNOSIS — Z0000 Encounter for general adult medical examination without abnormal findings: Secondary | ICD-10-CM | POA: Diagnosis not present

## 2017-03-13 DIAGNOSIS — J3089 Other allergic rhinitis: Secondary | ICD-10-CM | POA: Diagnosis not present

## 2017-03-17 DIAGNOSIS — Z1212 Encounter for screening for malignant neoplasm of rectum: Secondary | ICD-10-CM | POA: Diagnosis not present

## 2017-03-24 DIAGNOSIS — G4733 Obstructive sleep apnea (adult) (pediatric): Secondary | ICD-10-CM | POA: Diagnosis not present

## 2017-04-24 DIAGNOSIS — G4733 Obstructive sleep apnea (adult) (pediatric): Secondary | ICD-10-CM | POA: Diagnosis not present

## 2017-04-28 DIAGNOSIS — M9905 Segmental and somatic dysfunction of pelvic region: Secondary | ICD-10-CM | POA: Diagnosis not present

## 2017-04-28 DIAGNOSIS — M9902 Segmental and somatic dysfunction of thoracic region: Secondary | ICD-10-CM | POA: Diagnosis not present

## 2017-04-28 DIAGNOSIS — M9901 Segmental and somatic dysfunction of cervical region: Secondary | ICD-10-CM | POA: Diagnosis not present

## 2017-04-28 DIAGNOSIS — M9903 Segmental and somatic dysfunction of lumbar region: Secondary | ICD-10-CM | POA: Diagnosis not present

## 2017-05-22 DIAGNOSIS — G4733 Obstructive sleep apnea (adult) (pediatric): Secondary | ICD-10-CM | POA: Diagnosis not present

## 2017-06-08 ENCOUNTER — Ambulatory Visit: Payer: BLUE CROSS/BLUE SHIELD | Admitting: Neurology

## 2017-06-22 DIAGNOSIS — G4733 Obstructive sleep apnea (adult) (pediatric): Secondary | ICD-10-CM | POA: Diagnosis not present

## 2017-07-16 DIAGNOSIS — M9905 Segmental and somatic dysfunction of pelvic region: Secondary | ICD-10-CM | POA: Diagnosis not present

## 2017-07-16 DIAGNOSIS — M9903 Segmental and somatic dysfunction of lumbar region: Secondary | ICD-10-CM | POA: Diagnosis not present

## 2017-07-16 DIAGNOSIS — M9901 Segmental and somatic dysfunction of cervical region: Secondary | ICD-10-CM | POA: Diagnosis not present

## 2017-07-16 DIAGNOSIS — M9902 Segmental and somatic dysfunction of thoracic region: Secondary | ICD-10-CM | POA: Diagnosis not present

## 2017-10-09 DIAGNOSIS — M9905 Segmental and somatic dysfunction of pelvic region: Secondary | ICD-10-CM | POA: Diagnosis not present

## 2017-10-09 DIAGNOSIS — M9902 Segmental and somatic dysfunction of thoracic region: Secondary | ICD-10-CM | POA: Diagnosis not present

## 2017-10-09 DIAGNOSIS — M9901 Segmental and somatic dysfunction of cervical region: Secondary | ICD-10-CM | POA: Diagnosis not present

## 2017-10-09 DIAGNOSIS — M9903 Segmental and somatic dysfunction of lumbar region: Secondary | ICD-10-CM | POA: Diagnosis not present

## 2017-12-05 DIAGNOSIS — Z23 Encounter for immunization: Secondary | ICD-10-CM | POA: Diagnosis not present

## 2017-12-17 DIAGNOSIS — M9901 Segmental and somatic dysfunction of cervical region: Secondary | ICD-10-CM | POA: Diagnosis not present

## 2017-12-17 DIAGNOSIS — M9903 Segmental and somatic dysfunction of lumbar region: Secondary | ICD-10-CM | POA: Diagnosis not present

## 2017-12-17 DIAGNOSIS — M9902 Segmental and somatic dysfunction of thoracic region: Secondary | ICD-10-CM | POA: Diagnosis not present

## 2017-12-17 DIAGNOSIS — M9905 Segmental and somatic dysfunction of pelvic region: Secondary | ICD-10-CM | POA: Diagnosis not present

## 2018-01-22 DIAGNOSIS — J329 Chronic sinusitis, unspecified: Secondary | ICD-10-CM | POA: Diagnosis not present

## 2018-01-22 DIAGNOSIS — J339 Nasal polyp, unspecified: Secondary | ICD-10-CM | POA: Diagnosis not present

## 2018-01-22 DIAGNOSIS — J309 Allergic rhinitis, unspecified: Secondary | ICD-10-CM | POA: Diagnosis not present

## 2018-03-15 DIAGNOSIS — M9902 Segmental and somatic dysfunction of thoracic region: Secondary | ICD-10-CM | POA: Diagnosis not present

## 2018-03-15 DIAGNOSIS — M9901 Segmental and somatic dysfunction of cervical region: Secondary | ICD-10-CM | POA: Diagnosis not present

## 2018-03-15 DIAGNOSIS — M9905 Segmental and somatic dysfunction of pelvic region: Secondary | ICD-10-CM | POA: Diagnosis not present

## 2018-03-15 DIAGNOSIS — M9903 Segmental and somatic dysfunction of lumbar region: Secondary | ICD-10-CM | POA: Diagnosis not present

## 2018-03-16 DIAGNOSIS — M109 Gout, unspecified: Secondary | ICD-10-CM | POA: Diagnosis not present

## 2018-03-16 DIAGNOSIS — Z0000 Encounter for general adult medical examination without abnormal findings: Secondary | ICD-10-CM | POA: Diagnosis not present

## 2018-03-16 DIAGNOSIS — Z125 Encounter for screening for malignant neoplasm of prostate: Secondary | ICD-10-CM | POA: Diagnosis not present

## 2018-03-16 DIAGNOSIS — R7309 Other abnormal glucose: Secondary | ICD-10-CM | POA: Diagnosis not present

## 2018-03-16 DIAGNOSIS — I10 Essential (primary) hypertension: Secondary | ICD-10-CM | POA: Diagnosis not present

## 2018-03-19 DIAGNOSIS — Z0000 Encounter for general adult medical examination without abnormal findings: Secondary | ICD-10-CM | POA: Diagnosis not present

## 2018-03-19 DIAGNOSIS — I2510 Atherosclerotic heart disease of native coronary artery without angina pectoris: Secondary | ICD-10-CM | POA: Diagnosis not present

## 2018-03-19 DIAGNOSIS — E7849 Other hyperlipidemia: Secondary | ICD-10-CM | POA: Diagnosis not present

## 2018-03-19 DIAGNOSIS — Z86718 Personal history of other venous thrombosis and embolism: Secondary | ICD-10-CM | POA: Diagnosis not present

## 2018-03-19 DIAGNOSIS — I10 Essential (primary) hypertension: Secondary | ICD-10-CM | POA: Diagnosis not present

## 2018-03-19 DIAGNOSIS — Z1331 Encounter for screening for depression: Secondary | ICD-10-CM | POA: Diagnosis not present

## 2018-04-30 DIAGNOSIS — Z01818 Encounter for other preprocedural examination: Secondary | ICD-10-CM | POA: Diagnosis not present

## 2018-05-09 DIAGNOSIS — J111 Influenza due to unidentified influenza virus with other respiratory manifestations: Secondary | ICD-10-CM | POA: Diagnosis not present

## 2018-05-09 DIAGNOSIS — M791 Myalgia, unspecified site: Secondary | ICD-10-CM | POA: Diagnosis not present

## 2018-06-25 DIAGNOSIS — M9905 Segmental and somatic dysfunction of pelvic region: Secondary | ICD-10-CM | POA: Diagnosis not present

## 2018-06-25 DIAGNOSIS — M9903 Segmental and somatic dysfunction of lumbar region: Secondary | ICD-10-CM | POA: Diagnosis not present

## 2018-06-25 DIAGNOSIS — M9901 Segmental and somatic dysfunction of cervical region: Secondary | ICD-10-CM | POA: Diagnosis not present

## 2018-06-25 DIAGNOSIS — M9902 Segmental and somatic dysfunction of thoracic region: Secondary | ICD-10-CM | POA: Diagnosis not present

## 2018-10-08 DIAGNOSIS — M9902 Segmental and somatic dysfunction of thoracic region: Secondary | ICD-10-CM | POA: Diagnosis not present

## 2018-10-08 DIAGNOSIS — M9905 Segmental and somatic dysfunction of pelvic region: Secondary | ICD-10-CM | POA: Diagnosis not present

## 2018-10-08 DIAGNOSIS — M9903 Segmental and somatic dysfunction of lumbar region: Secondary | ICD-10-CM | POA: Diagnosis not present

## 2018-10-08 DIAGNOSIS — M9901 Segmental and somatic dysfunction of cervical region: Secondary | ICD-10-CM | POA: Diagnosis not present

## 2018-10-28 DIAGNOSIS — K5730 Diverticulosis of large intestine without perforation or abscess without bleeding: Secondary | ICD-10-CM | POA: Diagnosis not present

## 2018-10-28 DIAGNOSIS — Z1211 Encounter for screening for malignant neoplasm of colon: Secondary | ICD-10-CM | POA: Diagnosis not present

## 2019-02-17 DIAGNOSIS — M9902 Segmental and somatic dysfunction of thoracic region: Secondary | ICD-10-CM | POA: Diagnosis not present

## 2019-02-17 DIAGNOSIS — M9905 Segmental and somatic dysfunction of pelvic region: Secondary | ICD-10-CM | POA: Diagnosis not present

## 2019-02-17 DIAGNOSIS — M9901 Segmental and somatic dysfunction of cervical region: Secondary | ICD-10-CM | POA: Diagnosis not present

## 2019-02-17 DIAGNOSIS — M9903 Segmental and somatic dysfunction of lumbar region: Secondary | ICD-10-CM | POA: Diagnosis not present

## 2019-03-17 DIAGNOSIS — M109 Gout, unspecified: Secondary | ICD-10-CM | POA: Diagnosis not present

## 2019-03-17 DIAGNOSIS — I10 Essential (primary) hypertension: Secondary | ICD-10-CM | POA: Diagnosis not present

## 2019-03-17 DIAGNOSIS — E7849 Other hyperlipidemia: Secondary | ICD-10-CM | POA: Diagnosis not present

## 2019-03-17 DIAGNOSIS — R739 Hyperglycemia, unspecified: Secondary | ICD-10-CM | POA: Diagnosis not present

## 2019-03-17 DIAGNOSIS — Z125 Encounter for screening for malignant neoplasm of prostate: Secondary | ICD-10-CM | POA: Diagnosis not present

## 2019-03-25 DIAGNOSIS — M1990 Unspecified osteoarthritis, unspecified site: Secondary | ICD-10-CM | POA: Diagnosis not present

## 2019-03-25 DIAGNOSIS — Z1331 Encounter for screening for depression: Secondary | ICD-10-CM | POA: Diagnosis not present

## 2019-03-25 DIAGNOSIS — Z0000 Encounter for general adult medical examination without abnormal findings: Secondary | ICD-10-CM | POA: Diagnosis not present

## 2019-03-25 DIAGNOSIS — I2510 Atherosclerotic heart disease of native coronary artery without angina pectoris: Secondary | ICD-10-CM | POA: Diagnosis not present

## 2019-03-25 DIAGNOSIS — N401 Enlarged prostate with lower urinary tract symptoms: Secondary | ICD-10-CM | POA: Diagnosis not present

## 2019-03-25 DIAGNOSIS — H6123 Impacted cerumen, bilateral: Secondary | ICD-10-CM | POA: Diagnosis not present

## 2019-05-26 DIAGNOSIS — Z23 Encounter for immunization: Secondary | ICD-10-CM | POA: Diagnosis not present

## 2019-06-18 DIAGNOSIS — Z23 Encounter for immunization: Secondary | ICD-10-CM | POA: Diagnosis not present

## 2019-07-06 DIAGNOSIS — M9903 Segmental and somatic dysfunction of lumbar region: Secondary | ICD-10-CM | POA: Diagnosis not present

## 2019-07-06 DIAGNOSIS — M9905 Segmental and somatic dysfunction of pelvic region: Secondary | ICD-10-CM | POA: Diagnosis not present

## 2019-07-06 DIAGNOSIS — M9901 Segmental and somatic dysfunction of cervical region: Secondary | ICD-10-CM | POA: Diagnosis not present

## 2019-07-06 DIAGNOSIS — M9902 Segmental and somatic dysfunction of thoracic region: Secondary | ICD-10-CM | POA: Diagnosis not present

## 2019-09-13 DIAGNOSIS — H6122 Impacted cerumen, left ear: Secondary | ICD-10-CM | POA: Diagnosis not present

## 2019-09-30 DIAGNOSIS — C4401 Basal cell carcinoma of skin of lip: Secondary | ICD-10-CM | POA: Diagnosis not present

## 2019-09-30 DIAGNOSIS — L820 Inflamed seborrheic keratosis: Secondary | ICD-10-CM | POA: Diagnosis not present

## 2019-09-30 DIAGNOSIS — L570 Actinic keratosis: Secondary | ICD-10-CM | POA: Diagnosis not present

## 2019-11-08 DIAGNOSIS — C4401 Basal cell carcinoma of skin of lip: Secondary | ICD-10-CM | POA: Diagnosis not present

## 2019-12-08 DIAGNOSIS — M9905 Segmental and somatic dysfunction of pelvic region: Secondary | ICD-10-CM | POA: Diagnosis not present

## 2019-12-08 DIAGNOSIS — M9901 Segmental and somatic dysfunction of cervical region: Secondary | ICD-10-CM | POA: Diagnosis not present

## 2019-12-08 DIAGNOSIS — M9903 Segmental and somatic dysfunction of lumbar region: Secondary | ICD-10-CM | POA: Diagnosis not present

## 2019-12-08 DIAGNOSIS — M9902 Segmental and somatic dysfunction of thoracic region: Secondary | ICD-10-CM | POA: Diagnosis not present

## 2019-12-29 ENCOUNTER — Ambulatory Visit (HOSPITAL_COMMUNITY)
Admission: RE | Admit: 2019-12-29 | Discharge: 2019-12-29 | Disposition: A | Discharge status: Home / Self Care | Payer: BC Managed Care – PPO | Source: Ambulatory Visit | Attending: Internal Medicine | Admitting: Internal Medicine

## 2019-12-29 ENCOUNTER — Ambulatory Visit (INDEPENDENT_AMBULATORY_CARE_PROVIDER_SITE_OTHER)
Admission: RE | Admit: 2019-12-29 | Discharge: 2019-12-29 | Disposition: A | Discharge status: Home / Self Care | Payer: BC Managed Care – PPO | Source: Ambulatory Visit

## 2019-12-29 ENCOUNTER — Encounter (HOSPITAL_COMMUNITY): Payer: BLUE CROSS/BLUE SHIELD

## 2019-12-29 ENCOUNTER — Other Ambulatory Visit (HOSPITAL_COMMUNITY): Payer: Self-pay | Admitting: Internal Medicine

## 2019-12-29 ENCOUNTER — Other Ambulatory Visit: Payer: Self-pay

## 2019-12-29 DIAGNOSIS — M79605 Pain in left leg: Secondary | ICD-10-CM

## 2020-02-13 DIAGNOSIS — M9905 Segmental and somatic dysfunction of pelvic region: Secondary | ICD-10-CM | POA: Diagnosis not present

## 2020-02-13 DIAGNOSIS — M9901 Segmental and somatic dysfunction of cervical region: Secondary | ICD-10-CM | POA: Diagnosis not present

## 2020-02-13 DIAGNOSIS — M9903 Segmental and somatic dysfunction of lumbar region: Secondary | ICD-10-CM | POA: Diagnosis not present

## 2020-02-13 DIAGNOSIS — M9902 Segmental and somatic dysfunction of thoracic region: Secondary | ICD-10-CM | POA: Diagnosis not present

## 2020-03-23 DIAGNOSIS — M109 Gout, unspecified: Secondary | ICD-10-CM | POA: Diagnosis not present

## 2020-03-23 DIAGNOSIS — Z125 Encounter for screening for malignant neoplasm of prostate: Secondary | ICD-10-CM | POA: Diagnosis not present

## 2020-03-23 DIAGNOSIS — E785 Hyperlipidemia, unspecified: Secondary | ICD-10-CM | POA: Diagnosis not present

## 2020-03-26 DIAGNOSIS — Z1212 Encounter for screening for malignant neoplasm of rectum: Secondary | ICD-10-CM | POA: Diagnosis not present

## 2020-03-26 DIAGNOSIS — R82998 Other abnormal findings in urine: Secondary | ICD-10-CM | POA: Diagnosis not present

## 2020-03-26 DIAGNOSIS — Z0000 Encounter for general adult medical examination without abnormal findings: Secondary | ICD-10-CM | POA: Diagnosis not present

## 2020-03-26 DIAGNOSIS — I10 Essential (primary) hypertension: Secondary | ICD-10-CM | POA: Diagnosis not present

## 2020-05-28 DIAGNOSIS — L918 Other hypertrophic disorders of the skin: Secondary | ICD-10-CM | POA: Diagnosis not present

## 2020-05-28 DIAGNOSIS — D0439 Carcinoma in situ of skin of other parts of face: Secondary | ICD-10-CM | POA: Diagnosis not present

## 2020-05-28 DIAGNOSIS — Z85828 Personal history of other malignant neoplasm of skin: Secondary | ICD-10-CM | POA: Diagnosis not present

## 2020-05-28 DIAGNOSIS — B360 Pityriasis versicolor: Secondary | ICD-10-CM | POA: Diagnosis not present

## 2020-05-28 DIAGNOSIS — L821 Other seborrheic keratosis: Secondary | ICD-10-CM | POA: Diagnosis not present

## 2020-05-28 DIAGNOSIS — L570 Actinic keratosis: Secondary | ICD-10-CM | POA: Diagnosis not present

## 2020-07-02 DIAGNOSIS — M9903 Segmental and somatic dysfunction of lumbar region: Secondary | ICD-10-CM | POA: Diagnosis not present

## 2020-07-02 DIAGNOSIS — M9905 Segmental and somatic dysfunction of pelvic region: Secondary | ICD-10-CM | POA: Diagnosis not present

## 2020-07-02 DIAGNOSIS — M9902 Segmental and somatic dysfunction of thoracic region: Secondary | ICD-10-CM | POA: Diagnosis not present

## 2020-07-02 DIAGNOSIS — M9901 Segmental and somatic dysfunction of cervical region: Secondary | ICD-10-CM | POA: Diagnosis not present

## 2020-07-31 DIAGNOSIS — D0439 Carcinoma in situ of skin of other parts of face: Secondary | ICD-10-CM | POA: Diagnosis not present

## 2020-09-14 DIAGNOSIS — M9903 Segmental and somatic dysfunction of lumbar region: Secondary | ICD-10-CM | POA: Diagnosis not present

## 2020-09-14 DIAGNOSIS — M791 Myalgia, unspecified site: Secondary | ICD-10-CM | POA: Diagnosis not present

## 2020-09-14 DIAGNOSIS — M9902 Segmental and somatic dysfunction of thoracic region: Secondary | ICD-10-CM | POA: Diagnosis not present

## 2020-09-14 DIAGNOSIS — M9905 Segmental and somatic dysfunction of pelvic region: Secondary | ICD-10-CM | POA: Diagnosis not present

## 2020-09-14 DIAGNOSIS — M9901 Segmental and somatic dysfunction of cervical region: Secondary | ICD-10-CM | POA: Diagnosis not present

## 2020-11-16 DIAGNOSIS — M9901 Segmental and somatic dysfunction of cervical region: Secondary | ICD-10-CM | POA: Diagnosis not present

## 2020-11-16 DIAGNOSIS — M791 Myalgia, unspecified site: Secondary | ICD-10-CM | POA: Diagnosis not present

## 2020-11-16 DIAGNOSIS — M9905 Segmental and somatic dysfunction of pelvic region: Secondary | ICD-10-CM | POA: Diagnosis not present

## 2020-11-16 DIAGNOSIS — M9903 Segmental and somatic dysfunction of lumbar region: Secondary | ICD-10-CM | POA: Diagnosis not present

## 2020-11-16 DIAGNOSIS — M9902 Segmental and somatic dysfunction of thoracic region: Secondary | ICD-10-CM | POA: Diagnosis not present

## 2020-11-21 DIAGNOSIS — Z85828 Personal history of other malignant neoplasm of skin: Secondary | ICD-10-CM | POA: Diagnosis not present

## 2020-11-21 DIAGNOSIS — L309 Dermatitis, unspecified: Secondary | ICD-10-CM | POA: Diagnosis not present

## 2021-01-22 DIAGNOSIS — M9902 Segmental and somatic dysfunction of thoracic region: Secondary | ICD-10-CM | POA: Diagnosis not present

## 2021-01-22 DIAGNOSIS — M9901 Segmental and somatic dysfunction of cervical region: Secondary | ICD-10-CM | POA: Diagnosis not present

## 2021-01-22 DIAGNOSIS — M62838 Other muscle spasm: Secondary | ICD-10-CM | POA: Diagnosis not present

## 2021-01-22 DIAGNOSIS — M9905 Segmental and somatic dysfunction of pelvic region: Secondary | ICD-10-CM | POA: Diagnosis not present

## 2021-01-22 DIAGNOSIS — M9903 Segmental and somatic dysfunction of lumbar region: Secondary | ICD-10-CM | POA: Diagnosis not present

## 2021-04-02 DIAGNOSIS — Z125 Encounter for screening for malignant neoplasm of prostate: Secondary | ICD-10-CM | POA: Diagnosis not present

## 2021-04-02 DIAGNOSIS — M109 Gout, unspecified: Secondary | ICD-10-CM | POA: Diagnosis not present

## 2021-04-02 DIAGNOSIS — E785 Hyperlipidemia, unspecified: Secondary | ICD-10-CM | POA: Diagnosis not present

## 2021-04-02 DIAGNOSIS — R7989 Other specified abnormal findings of blood chemistry: Secondary | ICD-10-CM | POA: Diagnosis not present

## 2021-04-08 DIAGNOSIS — I10 Essential (primary) hypertension: Secondary | ICD-10-CM | POA: Diagnosis not present

## 2021-04-08 DIAGNOSIS — Z23 Encounter for immunization: Secondary | ICD-10-CM | POA: Diagnosis not present

## 2021-04-08 DIAGNOSIS — Z1331 Encounter for screening for depression: Secondary | ICD-10-CM | POA: Diagnosis not present

## 2021-04-08 DIAGNOSIS — Z0000 Encounter for general adult medical examination without abnormal findings: Secondary | ICD-10-CM | POA: Diagnosis not present

## 2021-04-08 DIAGNOSIS — R82998 Other abnormal findings in urine: Secondary | ICD-10-CM | POA: Diagnosis not present

## 2021-04-23 DIAGNOSIS — Z1212 Encounter for screening for malignant neoplasm of rectum: Secondary | ICD-10-CM | POA: Diagnosis not present

## 2021-04-25 DIAGNOSIS — M25562 Pain in left knee: Secondary | ICD-10-CM | POA: Diagnosis not present

## 2021-05-27 DIAGNOSIS — L570 Actinic keratosis: Secondary | ICD-10-CM | POA: Diagnosis not present

## 2021-05-27 DIAGNOSIS — B360 Pityriasis versicolor: Secondary | ICD-10-CM | POA: Diagnosis not present

## 2021-05-27 DIAGNOSIS — Z85828 Personal history of other malignant neoplasm of skin: Secondary | ICD-10-CM | POA: Diagnosis not present

## 2021-05-27 DIAGNOSIS — D225 Melanocytic nevi of trunk: Secondary | ICD-10-CM | POA: Diagnosis not present

## 2021-05-27 DIAGNOSIS — L918 Other hypertrophic disorders of the skin: Secondary | ICD-10-CM | POA: Diagnosis not present

## 2021-07-09 DIAGNOSIS — M7541 Impingement syndrome of right shoulder: Secondary | ICD-10-CM | POA: Diagnosis not present

## 2021-07-09 DIAGNOSIS — M9903 Segmental and somatic dysfunction of lumbar region: Secondary | ICD-10-CM | POA: Diagnosis not present

## 2021-07-09 DIAGNOSIS — M9905 Segmental and somatic dysfunction of pelvic region: Secondary | ICD-10-CM | POA: Diagnosis not present

## 2021-07-09 DIAGNOSIS — M9901 Segmental and somatic dysfunction of cervical region: Secondary | ICD-10-CM | POA: Diagnosis not present

## 2021-07-09 DIAGNOSIS — M9902 Segmental and somatic dysfunction of thoracic region: Secondary | ICD-10-CM | POA: Diagnosis not present

## 2021-08-12 DIAGNOSIS — M25562 Pain in left knee: Secondary | ICD-10-CM | POA: Diagnosis not present

## 2021-08-23 DIAGNOSIS — M25562 Pain in left knee: Secondary | ICD-10-CM | POA: Diagnosis not present

## 2021-08-29 DIAGNOSIS — M170 Bilateral primary osteoarthritis of knee: Secondary | ICD-10-CM | POA: Diagnosis not present

## 2021-09-11 DIAGNOSIS — M25562 Pain in left knee: Secondary | ICD-10-CM | POA: Diagnosis not present

## 2021-09-27 DIAGNOSIS — M9902 Segmental and somatic dysfunction of thoracic region: Secondary | ICD-10-CM | POA: Diagnosis not present

## 2021-09-27 DIAGNOSIS — M9901 Segmental and somatic dysfunction of cervical region: Secondary | ICD-10-CM | POA: Diagnosis not present

## 2021-09-27 DIAGNOSIS — M9905 Segmental and somatic dysfunction of pelvic region: Secondary | ICD-10-CM | POA: Diagnosis not present

## 2021-09-27 DIAGNOSIS — M9903 Segmental and somatic dysfunction of lumbar region: Secondary | ICD-10-CM | POA: Diagnosis not present

## 2021-09-27 DIAGNOSIS — M9907 Segmental and somatic dysfunction of upper extremity: Secondary | ICD-10-CM | POA: Diagnosis not present

## 2021-11-05 DIAGNOSIS — S83242A Other tear of medial meniscus, current injury, left knee, initial encounter: Secondary | ICD-10-CM | POA: Diagnosis not present

## 2021-12-23 DIAGNOSIS — M9905 Segmental and somatic dysfunction of pelvic region: Secondary | ICD-10-CM | POA: Diagnosis not present

## 2021-12-23 DIAGNOSIS — M9903 Segmental and somatic dysfunction of lumbar region: Secondary | ICD-10-CM | POA: Diagnosis not present

## 2021-12-23 DIAGNOSIS — M9902 Segmental and somatic dysfunction of thoracic region: Secondary | ICD-10-CM | POA: Diagnosis not present

## 2021-12-23 DIAGNOSIS — M9907 Segmental and somatic dysfunction of upper extremity: Secondary | ICD-10-CM | POA: Diagnosis not present

## 2021-12-23 DIAGNOSIS — M9901 Segmental and somatic dysfunction of cervical region: Secondary | ICD-10-CM | POA: Diagnosis not present

## 2022-03-05 DIAGNOSIS — M9901 Segmental and somatic dysfunction of cervical region: Secondary | ICD-10-CM | POA: Diagnosis not present

## 2022-03-05 DIAGNOSIS — M9907 Segmental and somatic dysfunction of upper extremity: Secondary | ICD-10-CM | POA: Diagnosis not present

## 2022-03-05 DIAGNOSIS — M9903 Segmental and somatic dysfunction of lumbar region: Secondary | ICD-10-CM | POA: Diagnosis not present

## 2022-03-05 DIAGNOSIS — M9905 Segmental and somatic dysfunction of pelvic region: Secondary | ICD-10-CM | POA: Diagnosis not present

## 2022-03-05 DIAGNOSIS — M9902 Segmental and somatic dysfunction of thoracic region: Secondary | ICD-10-CM | POA: Diagnosis not present

## 2022-03-27 DIAGNOSIS — M791 Myalgia, unspecified site: Secondary | ICD-10-CM | POA: Diagnosis not present

## 2022-03-27 DIAGNOSIS — J3489 Other specified disorders of nose and nasal sinuses: Secondary | ICD-10-CM | POA: Diagnosis not present

## 2022-03-27 DIAGNOSIS — R051 Acute cough: Secondary | ICD-10-CM | POA: Diagnosis not present

## 2022-03-27 DIAGNOSIS — R0981 Nasal congestion: Secondary | ICD-10-CM | POA: Diagnosis not present

## 2022-04-17 DIAGNOSIS — R7989 Other specified abnormal findings of blood chemistry: Secondary | ICD-10-CM | POA: Diagnosis not present

## 2022-04-17 DIAGNOSIS — M109 Gout, unspecified: Secondary | ICD-10-CM | POA: Diagnosis not present

## 2022-04-17 DIAGNOSIS — I10 Essential (primary) hypertension: Secondary | ICD-10-CM | POA: Diagnosis not present

## 2022-04-17 DIAGNOSIS — R739 Hyperglycemia, unspecified: Secondary | ICD-10-CM | POA: Diagnosis not present

## 2022-04-17 DIAGNOSIS — Z125 Encounter for screening for malignant neoplasm of prostate: Secondary | ICD-10-CM | POA: Diagnosis not present

## 2022-04-17 DIAGNOSIS — R5383 Other fatigue: Secondary | ICD-10-CM | POA: Diagnosis not present

## 2022-04-17 DIAGNOSIS — E785 Hyperlipidemia, unspecified: Secondary | ICD-10-CM | POA: Diagnosis not present

## 2022-04-21 DIAGNOSIS — F1021 Alcohol dependence, in remission: Secondary | ICD-10-CM | POA: Diagnosis not present

## 2022-04-21 DIAGNOSIS — R82998 Other abnormal findings in urine: Secondary | ICD-10-CM | POA: Diagnosis not present

## 2022-04-21 DIAGNOSIS — I10 Essential (primary) hypertension: Secondary | ICD-10-CM | POA: Diagnosis not present

## 2022-04-21 DIAGNOSIS — Z1331 Encounter for screening for depression: Secondary | ICD-10-CM | POA: Diagnosis not present

## 2022-04-21 DIAGNOSIS — E785 Hyperlipidemia, unspecified: Secondary | ICD-10-CM | POA: Diagnosis not present

## 2022-04-21 DIAGNOSIS — I2510 Atherosclerotic heart disease of native coronary artery without angina pectoris: Secondary | ICD-10-CM | POA: Diagnosis not present

## 2022-04-21 DIAGNOSIS — Z0000 Encounter for general adult medical examination without abnormal findings: Secondary | ICD-10-CM | POA: Diagnosis not present

## 2022-04-21 DIAGNOSIS — Z23 Encounter for immunization: Secondary | ICD-10-CM | POA: Diagnosis not present

## 2022-05-01 DIAGNOSIS — Z1212 Encounter for screening for malignant neoplasm of rectum: Secondary | ICD-10-CM | POA: Diagnosis not present

## 2022-05-07 DIAGNOSIS — M9901 Segmental and somatic dysfunction of cervical region: Secondary | ICD-10-CM | POA: Diagnosis not present

## 2022-05-07 DIAGNOSIS — M9907 Segmental and somatic dysfunction of upper extremity: Secondary | ICD-10-CM | POA: Diagnosis not present

## 2022-05-07 DIAGNOSIS — M9903 Segmental and somatic dysfunction of lumbar region: Secondary | ICD-10-CM | POA: Diagnosis not present

## 2022-05-07 DIAGNOSIS — M9905 Segmental and somatic dysfunction of pelvic region: Secondary | ICD-10-CM | POA: Diagnosis not present

## 2022-05-07 DIAGNOSIS — M9902 Segmental and somatic dysfunction of thoracic region: Secondary | ICD-10-CM | POA: Diagnosis not present

## 2022-06-20 DIAGNOSIS — H6123 Impacted cerumen, bilateral: Secondary | ICD-10-CM | POA: Diagnosis not present

## 2022-07-16 ENCOUNTER — Encounter: Payer: Self-pay | Admitting: Urology

## 2022-08-08 DIAGNOSIS — M9903 Segmental and somatic dysfunction of lumbar region: Secondary | ICD-10-CM | POA: Diagnosis not present

## 2022-08-08 DIAGNOSIS — M9901 Segmental and somatic dysfunction of cervical region: Secondary | ICD-10-CM | POA: Diagnosis not present

## 2022-08-08 DIAGNOSIS — M9907 Segmental and somatic dysfunction of upper extremity: Secondary | ICD-10-CM | POA: Diagnosis not present

## 2022-08-08 DIAGNOSIS — M9905 Segmental and somatic dysfunction of pelvic region: Secondary | ICD-10-CM | POA: Diagnosis not present

## 2022-08-08 DIAGNOSIS — M9902 Segmental and somatic dysfunction of thoracic region: Secondary | ICD-10-CM | POA: Diagnosis not present

## 2022-08-20 ENCOUNTER — Encounter: Payer: Self-pay | Admitting: Urology

## 2022-08-20 ENCOUNTER — Ambulatory Visit: Payer: BC Managed Care – PPO | Admitting: Urology

## 2022-08-20 VITALS — BP 108/72 | HR 73 | Ht 74.0 in | Wt 225.0 lb

## 2022-08-20 DIAGNOSIS — D2271 Melanocytic nevi of right lower limb, including hip: Secondary | ICD-10-CM | POA: Diagnosis not present

## 2022-08-20 DIAGNOSIS — R3121 Asymptomatic microscopic hematuria: Secondary | ICD-10-CM | POA: Diagnosis not present

## 2022-08-20 DIAGNOSIS — R319 Hematuria, unspecified: Secondary | ICD-10-CM

## 2022-08-20 DIAGNOSIS — R351 Nocturia: Secondary | ICD-10-CM

## 2022-08-20 DIAGNOSIS — R3912 Poor urinary stream: Secondary | ICD-10-CM

## 2022-08-20 DIAGNOSIS — L570 Actinic keratosis: Secondary | ICD-10-CM | POA: Diagnosis not present

## 2022-08-20 DIAGNOSIS — B360 Pityriasis versicolor: Secondary | ICD-10-CM | POA: Diagnosis not present

## 2022-08-20 DIAGNOSIS — N400 Enlarged prostate without lower urinary tract symptoms: Secondary | ICD-10-CM | POA: Diagnosis not present

## 2022-08-20 DIAGNOSIS — N529 Male erectile dysfunction, unspecified: Secondary | ICD-10-CM

## 2022-08-20 DIAGNOSIS — Z85828 Personal history of other malignant neoplasm of skin: Secondary | ICD-10-CM | POA: Diagnosis not present

## 2022-08-20 DIAGNOSIS — L918 Other hypertrophic disorders of the skin: Secondary | ICD-10-CM | POA: Diagnosis not present

## 2022-08-20 LAB — URINALYSIS, COMPLETE
Bilirubin, UA: NEGATIVE
Glucose, UA: NEGATIVE
Ketones, UA: NEGATIVE
Leukocytes,UA: NEGATIVE
Nitrite, UA: NEGATIVE
Protein,UA: NEGATIVE
Specific Gravity, UA: 1.02 (ref 1.005–1.030)
Urobilinogen, Ur: 4 mg/dL — ABNORMAL HIGH (ref 0.2–1.0)
pH, UA: 6.5 (ref 5.0–7.5)

## 2022-08-20 LAB — MICROSCOPIC EXAMINATION

## 2022-08-20 LAB — BLADDER SCAN AMB NON-IMAGING

## 2022-08-20 MED ORDER — TADALAFIL 5 MG PO TABS: 5.0000 mg | ORAL_TABLET | Freq: Every day | ORAL | 11 refills | Status: AC | PRN

## 2022-08-20 NOTE — Patient Instructions (Addendum)
Nocturia refers to the need to wake up during the night to urinate, which can disrupt your sleep and impact your overall well-being. Fortunately, there are several strategies you can employ to help prevent or manage nocturia. It's important to consult with your healthcare provider before making any significant changes to your routine. Here are some helpful strategies to consider:  Limit Fluid Intake Before Bed: Avoid drinking large amounts of fluids in the evening, especially within a few hours of bedtime. Consume most of your daily fluid intake earlier in the day to reduce the need to urinate at night.  Monitor Your Diet: Limit your intake of caffeine and alcohol, as these substances can increase urine production and irritate the bladder.  Avoid diet, "zero calorie," and artificially sweetened drinks, especially sodas, in the afternoon or evening. Be mindful of consuming foods and drinks with high water content before bedtime, such as watermelon and herbal teas.  Time Your Medications: If you're taking medications that contribute to increased urination, consult your healthcare provider about adjusting the timing of these medications to minimize their impact during the night.  Practice Double Voiding: Before going to bed, make an effort to empty your bladder twice within a short period. This can help reduce the amount of urine left in your bladder before sleep.  Bladder Training: Gradually increase the time between bathroom visits during the day to train your bladder to hold larger volumes of urine. Over time, this can help reduce the frequency of nighttime awakenings to urinate.  Elevate Your Legs During the Day: Elevating your legs during the day can help minimize fluid retention in your lower extremities, which might reduce nighttime urination.  Pelvic Floor Exercises: Strengthening your pelvic floor muscles through Kegel exercises can help improve bladder control and potentially reduce  the urge to urinate at night.  Create a Relaxing Bedtime Routine: Stress and anxiety can exacerbate nocturia. Engage in calming activities before bed, such as reading, listening to soothing music, or practicing relaxation techniques.  Stay Active: Engage in regular physical activity, but avoid intense exercise close to bedtime, as this can increase your body's demand for fluids.  Maintain a Healthy Weight: Excess weight can compress the bladder and contribute to bladder and urinary issues. Aim to achieve and maintain a healthy weight through a balanced diet and regular exercise.  Remember that every individual is unique, and the effectiveness of these strategies may vary. It's important to work with your healthcare provider to develop a plan that suits your specific needs and addresses any underlying causes of nocturia.    Cystoscopy Cystoscopy is a procedure that is used to help diagnose and sometimes treat conditions that affect the lower urinary tract. The lower urinary tract includes the bladder and the urethra. The urethra is the tube that drains urine from the bladder. Cystoscopy is done using a thin, tube-shaped instrument with a light and camera at the end (cystoscope). The cystoscope may be hard or flexible, depending on the goal of the procedure. The cystoscope is inserted through the urethra, into the bladder. Cystoscopy may be recommended if you have: Urinary tract infections that keep coming back. Blood in the urine (hematuria). An inability to control when you urinate (urinary incontinence) or an overactive bladder. Unusual cells found in a urine sample. A blockage in the urethra, such as a urinary stone. Painful urination. An abnormality in the bladder found during an intravenous pyelogram (IVP) or CT scan. What are the risks? Generally, this is a safe procedure. However,  problems may occur, including: Infection. Bleeding.  What happens during the procedure?  You will  be given one or more of the following: A medicine to numb the area (local anesthetic). The area around the opening of your urethra will be cleaned. The cystoscope will be passed through your urethra into your bladder. Germ-free (sterile) fluid will flow through the cystoscope to fill your bladder. The fluid will stretch your bladder so that your health care provider can clearly examine your bladder walls. Your doctor will look at the urethra and bladder. The cystoscope will be removed The procedure may vary among health care providers  What can I expect after the procedure? After the procedure, it is common to have: Some soreness or pain in your urethra. Urinary symptoms. These include: Mild pain or burning when you urinate. Pain should stop within a few minutes after you urinate. This may last for up to a few days after the procedure. A small amount of blood in your urine for several days. Feeling like you need to urinate but producing only a small amount of urine. Follow these instructions at home: General instructions Return to your normal activities as told by your health care provider.  Drink plenty of fluids after the procedure. Keep all follow-up visits as told by your health care provider. This is important. Contact a health care provider if you: Have pain that gets worse or does not get better with medicine, especially pain when you urinate lasting longer than 72 hours after the procedure. Have trouble urinating. Get help right away if you: Have blood clots in your urine. Have a fever or chills. Are unable to urinate. Summary Cystoscopy is a procedure that is used to help diagnose and sometimes treat conditions that affect the lower urinary tract. Cystoscopy is done using a thin, tube-shaped instrument with a light and camera at the end. After the procedure, it is common to have some soreness or pain in your urethra. It is normal to have blood in your urine after the  procedure.  If you were prescribed an antibiotic medicine, take it as told by your health care provider.  This information is not intended to replace advice given to you by your health care provider. Make sure you discuss any questions you have with your health care provider. Document Revised: 02/09/2018 Document Reviewed: 02/09/2018 Elsevier Patient Education  2020 [employer].

## 2022-08-20 NOTE — Progress Notes (Signed)
08/20/22 3:42 PM   Darrell Coleman Nov 14, 1960 [id number]  CC: ED, urinary symptoms, PSA screening  HPI: 62 year old male I am seeing for the first time for the above issues.  He has a long history of mild urinary symptoms with nocturia 1-2 times overnight, some occasional weak stream during the day, and has been on Flomax since at least 2016.  IPSS score today is 10, with quality-of-life mixed, PVR normal at 0ml.  Urinalysis today with microscopic hematuria with 3-10 RBC but otherwise benign.  No recent cross-sectional imaging to review.  He also has problems with erection and has used Cialis in the past with improvement.  He likes to avoid medications if possible.  He denies any gross hematuria.  Recent PSA from February 2024 normal at 0.62 which has been stable over the last 5 to 10 years.  PMH: Past Medical History:  Diagnosis Date   Alcohol abuse, in remission    Coronary artery disease    s/p cath 06/2011 with single vessel LAD disease; normal LV function; s/p normal Myoview testing; to manage medically   Dyslipidemia    GERD (gastroesophageal reflux disease)    Hypertension     Surgical History: Past Surgical History:  Procedure Laterality Date   CARDIAC CATHETERIZATION  06/02/2011   COLECTOMY  10/2006   sigmoid   HERNIA REPAIR  08/2007   ventral   hydrocelectomy     "as a kid"; left   INGUINAL HERNIA REPAIR     "as a child"; bilaterally   LEFT HEART CATHETERIZATION WITH CORONARY ANGIOGRAM N/A 06/02/2011   Procedure: LEFT HEART CATHETERIZATION WITH CORONARY ANGIOGRAM;  Surgeon: [name], MD;  Location: [facility] CATH LAB;  Service: Cardiovascular;  Laterality: N/A;   NASAL SINUS SURGERY  ~ 2006   polyp removal   SHOULDER ARTHROSCOPY  ~ 1996   right    Family History: Family History  Problem Relation Age of Onset   Heart attack Father    Hypertension Father    Alzheimer's disease Mother     Social History:  reports that he quit smoking about 24 years ago. His smoking  use included cigars. He has been exposed to tobacco smoke. He quit smokeless tobacco use about 39 years ago.  His smokeless tobacco use included chew. He reports current alcohol use. He reports current drug use. Drug: Marijuana.  Physical Exam: BP 108/72   Pulse 73   Ht 6\' 2"  (1.88 m)   Wt 225 lb (102.1 kg)   BMI 28.89 kg/m    Constitutional:  Alert and oriented, No acute distress. Cardiovascular: No clubbing, cyanosis, or edema. Respiratory: Normal respiratory effort, no increased work of breathing. GI: Abdomen is soft, nontender, nondistended, no abdominal masses   Laboratory Data: Reviewed  Assessment & Plan:   62 year old male with long history of mild urinary symptoms with nocturia 1-2 times overnight and some weak stream.  He has been on Flomax at least 10 years, he wonders if he still needs to be on this medication.  He denies any gross hematuria or dysuria.  New microscopic hematuria with 3-10 RBC today on UA.  Also interested in resuming Cialis as needed for ED.  We discussed common possible etiologies of microscopic hematuria including idiopathic, urolithiasis, medical renal disease, and malignancy. We discussed the new asymptomatic microscopic hematuria guidelines and risk categories of low, intermediate, and high risk that are based on age, risk factors like smoking, and degree of microscopic hematuria. We discussed work-up can range from  repeat urinalysis, renal ultrasound and cystoscopy, to CT urogram and cystoscopy.  Using shared decision making he opted for CT urogram and cystoscopy Trial off Flomax, okay to resume if urinary symptoms worsen Cialis 5 to 20 mg as needed for ED   Legrand Rams, MD 08/20/2022  [facility] [address], [address] [place], [place] [address] [phone]

## 2022-09-05 ENCOUNTER — Ambulatory Visit (HOSPITAL_COMMUNITY)
Admission: RE | Admit: 2022-09-05 | Discharge: 2022-09-05 | Disposition: A | Discharge status: Home / Self Care | Payer: BC Managed Care – PPO | Source: Ambulatory Visit | Attending: Urology | Admitting: Urology

## 2022-09-05 DIAGNOSIS — K4090 Unilateral inguinal hernia, without obstruction or gangrene, not specified as recurrent: Secondary | ICD-10-CM | POA: Diagnosis not present

## 2022-09-05 DIAGNOSIS — N400 Enlarged prostate without lower urinary tract symptoms: Secondary | ICD-10-CM

## 2022-09-05 DIAGNOSIS — R319 Hematuria, unspecified: Secondary | ICD-10-CM | POA: Diagnosis not present

## 2022-09-05 DIAGNOSIS — K439 Ventral hernia without obstruction or gangrene: Secondary | ICD-10-CM | POA: Diagnosis not present

## 2022-09-05 MED ORDER — SODIUM CHLORIDE (PF) 0.9 % IJ SOLN
INTRAMUSCULAR | Status: AC
  Filled 2022-09-05: qty 50

## 2022-09-05 MED ORDER — SODIUM CHLORIDE 0.9 % IV SOLN
INTRAVENOUS | Status: AC
  Filled 2022-09-05: qty 250

## 2022-09-05 MED ORDER — IOHEXOL 300 MG/ML  SOLN
100.0000 mL | Freq: Once | INTRAMUSCULAR | Status: AC | PRN
  Administered 2022-09-05: 100 mL via INTRAVENOUS

## 2022-09-16 ENCOUNTER — Ambulatory Visit: Payer: BC Managed Care – PPO | Admitting: Urology

## 2022-09-16 ENCOUNTER — Encounter: Payer: Self-pay | Admitting: Urology

## 2022-09-16 VITALS — BP 114/72 | HR 76 | Ht 74.0 in | Wt 225.0 lb

## 2022-09-16 DIAGNOSIS — R3121 Asymptomatic microscopic hematuria: Secondary | ICD-10-CM | POA: Diagnosis not present

## 2022-09-16 DIAGNOSIS — K869 Disease of pancreas, unspecified: Secondary | ICD-10-CM

## 2022-09-16 DIAGNOSIS — R319 Hematuria, unspecified: Secondary | ICD-10-CM

## 2022-09-16 MED ORDER — LIDOCAINE HCL URETHRAL/MUCOSAL 2 % EX GEL
1.0000 | Freq: Once | CUTANEOUS | Status: AC
  Administered 2022-09-16: 1 via URETHRAL

## 2022-09-16 NOTE — Progress Notes (Signed)
Cystoscopy Procedure Note:  Indication: Microscopic hematuria  After informed consent and discussion of the procedure and its risks, Darrell Coleman was positioned and prepped in the standard fashion. Cystoscopy was performed with a flexible cystoscope. The urethra, bladder neck and entire bladder was visualized in a standard fashion. The prostate was small to moderate in size. The ureteral orifices were visualized in their normal location and orientation.  Bladder mucosa grossly normal throughout, no suspicious abnormalities, no abnormalities on retroflexion.  Imaging: CT urogram with no urologic abnormalities, 1.5 cm indeterminate pancreatic lesion  Findings: Normal cystoscopy  Assessment and Plan: -Normal cystoscopy, no change in urinary symptoms after stopping Flomax -We reviewed the imaging findings and I ordered recommended MRI pancreas for further evaluation of the indeterminate pancreatic lesion, will call with results and refer as necessary pending those findings  Legrand Rams, MD 09/16/2022

## 2022-09-25 ENCOUNTER — Ambulatory Visit: Payer: BC Managed Care – PPO | Admitting: Urology

## 2022-09-30 ENCOUNTER — Other Ambulatory Visit: Payer: Self-pay | Admitting: Urology

## 2022-09-30 ENCOUNTER — Ambulatory Visit
Admission: RE | Admit: 2022-09-30 | Discharge: 2022-09-30 | Disposition: A | Discharge status: Home / Self Care | Payer: BC Managed Care – PPO | Source: Ambulatory Visit | Attending: Urology | Admitting: Urology

## 2022-09-30 DIAGNOSIS — K869 Disease of pancreas, unspecified: Secondary | ICD-10-CM | POA: Diagnosis not present

## 2022-09-30 DIAGNOSIS — K862 Cyst of pancreas: Secondary | ICD-10-CM | POA: Diagnosis not present

## 2022-09-30 DIAGNOSIS — R109 Unspecified abdominal pain: Secondary | ICD-10-CM | POA: Diagnosis not present

## 2022-09-30 DIAGNOSIS — D1809 Hemangioma of other sites: Secondary | ICD-10-CM | POA: Diagnosis not present

## 2022-09-30 DIAGNOSIS — K7689 Other specified diseases of liver: Secondary | ICD-10-CM | POA: Diagnosis not present

## 2022-09-30 MED ORDER — GADOBUTROL 1 MMOL/ML IV SOLN
10.0000 mL | Freq: Once | INTRAVENOUS | Status: AC | PRN
  Administered 2022-09-30: 10 mL via INTRAVENOUS

## 2022-10-29 DIAGNOSIS — M9902 Segmental and somatic dysfunction of thoracic region: Secondary | ICD-10-CM | POA: Diagnosis not present

## 2022-10-29 DIAGNOSIS — M9903 Segmental and somatic dysfunction of lumbar region: Secondary | ICD-10-CM | POA: Diagnosis not present

## 2022-10-29 DIAGNOSIS — M9905 Segmental and somatic dysfunction of pelvic region: Secondary | ICD-10-CM | POA: Diagnosis not present

## 2022-10-29 DIAGNOSIS — M9907 Segmental and somatic dysfunction of upper extremity: Secondary | ICD-10-CM | POA: Diagnosis not present

## 2022-10-29 DIAGNOSIS — M9901 Segmental and somatic dysfunction of cervical region: Secondary | ICD-10-CM | POA: Diagnosis not present

## 2023-01-19 DIAGNOSIS — M9907 Segmental and somatic dysfunction of upper extremity: Secondary | ICD-10-CM | POA: Diagnosis not present

## 2023-01-19 DIAGNOSIS — M9905 Segmental and somatic dysfunction of pelvic region: Secondary | ICD-10-CM | POA: Diagnosis not present

## 2023-01-19 DIAGNOSIS — M9903 Segmental and somatic dysfunction of lumbar region: Secondary | ICD-10-CM | POA: Diagnosis not present

## 2023-01-19 DIAGNOSIS — M9901 Segmental and somatic dysfunction of cervical region: Secondary | ICD-10-CM | POA: Diagnosis not present

## 2023-01-19 DIAGNOSIS — M9902 Segmental and somatic dysfunction of thoracic region: Secondary | ICD-10-CM | POA: Diagnosis not present

## 2023-04-21 DIAGNOSIS — N401 Enlarged prostate with lower urinary tract symptoms: Secondary | ICD-10-CM | POA: Diagnosis not present

## 2023-04-21 DIAGNOSIS — R739 Hyperglycemia, unspecified: Secondary | ICD-10-CM | POA: Diagnosis not present

## 2023-04-21 DIAGNOSIS — E785 Hyperlipidemia, unspecified: Secondary | ICD-10-CM | POA: Diagnosis not present

## 2023-04-21 DIAGNOSIS — M109 Gout, unspecified: Secondary | ICD-10-CM | POA: Diagnosis not present

## 2023-04-28 DIAGNOSIS — R82998 Other abnormal findings in urine: Secondary | ICD-10-CM | POA: Diagnosis not present

## 2023-04-28 DIAGNOSIS — I10 Essential (primary) hypertension: Secondary | ICD-10-CM | POA: Diagnosis not present

## 2023-04-28 DIAGNOSIS — Z0000 Encounter for general adult medical examination without abnormal findings: Secondary | ICD-10-CM | POA: Diagnosis not present

## 2023-04-28 DIAGNOSIS — Z1331 Encounter for screening for depression: Secondary | ICD-10-CM | POA: Diagnosis not present

## 2023-04-28 DIAGNOSIS — Z1339 Encounter for screening examination for other mental health and behavioral disorders: Secondary | ICD-10-CM | POA: Diagnosis not present

## 2023-09-24 DIAGNOSIS — L821 Other seborrheic keratosis: Secondary | ICD-10-CM | POA: Diagnosis not present

## 2023-09-24 DIAGNOSIS — D2261 Melanocytic nevi of right upper limb, including shoulder: Secondary | ICD-10-CM | POA: Diagnosis not present

## 2023-09-24 DIAGNOSIS — Z85828 Personal history of other malignant neoplasm of skin: Secondary | ICD-10-CM | POA: Diagnosis not present

## 2023-09-24 DIAGNOSIS — L570 Actinic keratosis: Secondary | ICD-10-CM | POA: Diagnosis not present

## 2023-09-24 DIAGNOSIS — L814 Other melanin hyperpigmentation: Secondary | ICD-10-CM | POA: Diagnosis not present

## 2023-09-29 NOTE — Progress Notes (Signed)
 Cardiology Office Note:    Date:  10/08/2023   ID:  Darrell Coleman, DOB 02/26/1961, MRN [id number]  PCP:  Onita Rush, MD   [facility] Providers Cardiologist:  None     Referring MD: Onita Rush, MD   Chief Complaint  Patient presents with   Coronary Artery Disease    History of Present Illness:    Darrell Coleman is a 63 y.o. male seen at the request of Dr Onita for evaluation of CAD. Last seen in 2018. He has a PMH of HTN, HLD, GERD and CAD. In April 2013, he had left heart cath which revealed single-vessel coronary artery disease involving moderate to severe lesion at in the ostium of diagonal as well as long segment of narrowing in the mid LAD which was felt to possibly present myocardial bridging. He was noted to have normal LV function with ejection fraction of 55-65%. It was recommended that he undergo nuclear stress test to assess the ischemic burden in the LAD territory. This was performed on 06/11/2011 which revealed no ischemia. Medical therapy was recommended. He had a history of fatigue on beta blocker. Last Myoview  obtained on 05/02/2014 showed low risk study with small basal inferolateral defect mostly fixed, EF 49% borderline. Last echocardiogram obtained on 05/04/2014 showed EF 55-60%, grade 1 diastolic dysfunction. Myoview  in 2018 showed normal perfusion. EF 47%.    On follow up today he has generally been doing well. About 6 weeks ago he had an episode of chest discomfort that resolved after 2 sl Ntg. States it felt different than indigestion. Notes he has been under more stress. He does go to the gym regularly and has no problems with his work out. Maybe slight SOB if going up a long incline.   Past Medical History:  Diagnosis Date   Alcohol  abuse, in remission    Coronary artery disease    s/p cath 06/2011 with single vessel LAD disease; normal LV function; s/p normal Myoview  testing; to manage medically   Dyslipidemia    GERD (gastroesophageal reflux disease)     Hepatic cyst    History of COVID-19    Hypertension    Pancreatic divisum     Past Surgical History:  Procedure Laterality Date   CARDIAC CATHETERIZATION  06/02/2011   COLECTOMY  10/2006   sigmoid   HERNIA REPAIR  08/2007   ventral   hydrocelectomy     as a kid; left   INGUINAL HERNIA REPAIR     as a child; bilaterally   LEFT HEART CATHETERIZATION WITH CORONARY ANGIOGRAM N/A 06/02/2011   Procedure: LEFT HEART CATHETERIZATION WITH CORONARY ANGIOGRAM;  Surgeon: Dai Mcadams M [place], MD;  Location: [facility] CATH LAB;  Service: Cardiovascular;  Laterality: N/A;   NASAL SINUS SURGERY  ~ 2006   polyp removal   SHOULDER ARTHROSCOPY  ~ 1996   right    Current Medications: Current Meds  Medication Sig   allopurinol (ZYLOPRIM) 100 MG tablet Take 100 mg by mouth daily.   aspirin  325 MG tablet Take 81 mg by mouth daily.    atorvastatin  (LIPITOR) 10 MG tablet Take 1 tablet (10 mg total) by mouth daily. <PLEASE SCHEDULE APPOINTMENT FOR REFILLS>   lisinopril  (ZESTRIL ) 10 MG tablet Take 10 mg by mouth daily.   Multiple Vitamin (MULTI-VITAMIN PO) Take by mouth daily.   nitroGLYCERIN  (NITROSTAT ) 0.4 MG SL tablet Place 1 tablet (0.4 mg total) under the tongue every 5 (five) minutes as needed. For chest pain   Omega-3  Fatty Acids (FISH OIL) 1200 MG CAPS Take 1,200 mg by mouth 2 (two) times daily.    tadalafil  (CIALIS ) 5 MG tablet Take 1-4 tablets (5-20 mg total) by mouth daily as needed for erectile dysfunction (take 45 minutes before sexual activity).     Allergies:   Penicillins   Social History   Socioeconomic History   Marital status: Married    Spouse name: Not on file   Number of children: Not on file   Years of education: Not on file   Highest education level: Not on file  Occupational History   Not on file  Tobacco Use   Smoking status: Former    Types: Cigars    Quit date: 03/03/1998    Years since quitting: 25.6    Passive exposure: Past   Smokeless tobacco: Former    Types: Chew     Quit date: 03/04/1983  Substance and Sexual Activity   Alcohol  use: Yes    Comment: last drink was 2011   Drug use: Yes    Types: Marijuana    Comment: last time was ?late 1980's   Sexual activity: Yes  Other Topics Concern   Not on file  Social History Narrative   He is an Public affairs consultant. He quit smoking > 10 years ago. He had problems with ETOH but quit > 1 year ago. He is married. His father died at 2 from an MI but his mother has no cardiac issues.   Social Drivers of Corporate investment banker Strain: Not on file  Food Insecurity: Not on file  Transportation Needs: Not on file  Physical Activity: Not on file  Stress: Not on file  Social Connections: Not on file     Family History: The patient's family history includes Alzheimer's disease in his mother; Heart attack in his father; Hypertension in his father.  ROS:   Please see the history of present illness.     All other systems reviewed and are negative.  EKGs/Labs/Other Studies Reviewed:    The following studies were reviewed today: EKG Interpretation Date/Time:  Thursday October 08 2023 11:20:14 EDT Ventricular Rate:  67 PR Interval:  172 QRS Duration:  94 QT Interval:  376 QTC Calculation: 397 R Axis:   26  Text Interpretation: Normal sinus rhythm Normal ECG When compared with ECG of October 16, 2016  No significant change was found  Confirmed by [place], Tashae Inda [id number]) on 10/08/2023 11:28:32 AM  Recent Labs: No results found for requested labs within last 365 days.  Recent Lipid Panel    Component Value Date/Time   CHOL 88 07/25/2011 0830   TRIG 93 07/25/2011 0830   HDL 37 (L) 07/25/2011 0830   CHOLHDL 3.6 06/03/2011 0400   VLDL 37 06/03/2011 0400   LDLCALC 32 07/25/2011 0830   Cated 04/21/23: choleterol 128, triglycerides 198, HDL 37, LDL 51. A1c 5.1%. CMET and CBC normal.   Risk Assessment/Calculations:                Physical Exam:    VS:  BP 114/75 (BP Location: Right Arm)   Pulse  67   Ht 6' 2 (1.88 m)   Wt 225 lb (102.1 kg)   SpO2 99%   BMI 28.89 kg/m     Wt Readings from Last 3 Encounters:  10/08/23 225 lb (102.1 kg)  09/16/22 225 lb (102.1 kg)  08/20/22 225 lb (102.1 kg)     GEN:  Well nourished, well developed in no acute  distress HEENT: Normal NECK: No JVD; No carotid bruits LYMPHATICS: No lymphadenopathy CARDIAC: RRR, no murmurs, rubs, gallops RESPIRATORY:  Clear to auscultation without rales, wheezing or rhonchi  ABDOMEN: Soft, non-tender, non-distended MUSCULOSKELETAL:  No edema; No deformity  SKIN: Warm and dry NEUROLOGIC:  Alert and oriented x 3 PSYCHIATRIC:  Normal affect   ASSESSMENT:    1. Coronary artery disease of native artery of native heart with stable angina pectoris (HCC)   2. Hypertension, unspecified type   3. Hypercholesteremia    PLAN:    In order of problems listed above:  CAD with history of diagonal disease by cath in 2013. Normal Myoview  in 2018. Minor symptoms now. Will update stress Myoview . Continue risk factor modification HTN. Well controlled on ACEi HLD LDL at goal on statin. 51.       Informed Consent   Shared Decision Making/Informed Consent The risks [chest pain, shortness of breath, cardiac arrhythmias, dizziness, blood pressure fluctuations, myocardial infarction, stroke/transient ischemic attack, nausea, vomiting, allergic reaction, radiation exposure, metallic taste sensation and life-threatening complications (estimated to be 1 in 10,000)], benefits (risk stratification, diagnosing coronary artery disease, treatment guidance) and alternatives of a nuclear stress test were discussed in detail with Darrell Coleman and he agrees to proceed.       Medication Adjustments/Labs and Tests Ordered: Current medicines are reviewed at length with the patient today.  Concerns regarding medicines are outlined above.  Orders Placed This Encounter  Procedures   Cardiac Stress Test: Informed Consent Details:  Physician/Practitioner Attestation; Transcribe to consent form and obtain patient signature   EKG 12-Lead   No orders of the defined types were placed in this encounter.   There are no Patient Instructions on file for this visit.   Signed, Romelle Reiley [place], MD  10/08/2023 11:42 AM    [facility]

## 2023-10-08 ENCOUNTER — Encounter: Payer: Self-pay | Admitting: Cardiology

## 2023-10-08 ENCOUNTER — Ambulatory Visit: Attending: Cardiology | Admitting: Cardiology

## 2023-10-08 VITALS — BP 114/75 | HR 67 | Ht 74.0 in | Wt 225.0 lb

## 2023-10-08 DIAGNOSIS — I10 Essential (primary) hypertension: Secondary | ICD-10-CM | POA: Diagnosis not present

## 2023-10-08 DIAGNOSIS — I25118 Atherosclerotic heart disease of native coronary artery with other forms of angina pectoris: Secondary | ICD-10-CM

## 2023-10-08 DIAGNOSIS — E7800 Pure hypercholesterolemia, unspecified: Secondary | ICD-10-CM

## 2023-10-08 NOTE — Patient Instructions (Addendum)
 Medication Instructions:  Continue same medications  Lab Work: None ordered  Testing/Procedures: Stress Myoview    First Available  Nothing to eat 4 hours before test No caffeine morning of test Wear comfortable clothes and shoes to walk No lotion You may take morning medications with water   Follow-Up: At [facility], you and your health needs are our priority.  As part of our continuing mission to provide you with exceptional heart care, our providers are all part of one team.  This team includes your primary Cardiologist (physician) and Advanced Practice Providers or APPs (Physician Assistants and Nurse Practitioners) who all work together to provide you with the care you need, when you need it.  Your next appointment:  To Be Determined     Provider:  Dr.Jordan   We recommend signing up for the patient portal called MyChart.  Sign up information is provided on this After Visit Summary.  MyChart is used to connect with patients for Virtual Visits (Telemedicine).  Patients are able to view lab/test results, encounter notes, upcoming appointments, etc.  Non-urgent messages can be sent to your provider as well.   To learn more about what you can do with MyChart, go to [email or web address].

## 2023-10-09 ENCOUNTER — Other Ambulatory Visit (HOSPITAL_COMMUNITY): Payer: Self-pay | Admitting: Internal Medicine

## 2023-10-09 ENCOUNTER — Telehealth (HOSPITAL_COMMUNITY): Payer: Self-pay | Admitting: *Deleted

## 2023-10-09 ENCOUNTER — Ambulatory Visit (HOSPITAL_COMMUNITY)
Admission: RE | Admit: 2023-10-09 | Discharge: 2023-10-09 | Disposition: A | Discharge status: Home / Self Care | Source: Ambulatory Visit | Attending: Vascular Surgery | Admitting: Vascular Surgery

## 2023-10-09 DIAGNOSIS — R52 Pain, unspecified: Secondary | ICD-10-CM

## 2023-10-09 NOTE — Telephone Encounter (Signed)
 Patient given detailed instructions per Myocardial Perfusion Study Information Sheet for the test on 10/14/2023 at 12:30. Patient notified to arrive 15 minutes early and that it is imperative to arrive on time for appointment to keep from having the test rescheduled.  If you need to cancel or reschedule your appointment, please call the office within 24 hours of your appointment. . Patient verbalized understanding.Darrell Coleman

## 2023-10-12 ENCOUNTER — Other Ambulatory Visit: Payer: Self-pay | Admitting: Cardiology

## 2023-10-12 DIAGNOSIS — E7800 Pure hypercholesterolemia, unspecified: Secondary | ICD-10-CM

## 2023-10-12 DIAGNOSIS — I25118 Atherosclerotic heart disease of native coronary artery with other forms of angina pectoris: Secondary | ICD-10-CM

## 2023-10-12 DIAGNOSIS — I10 Essential (primary) hypertension: Secondary | ICD-10-CM

## 2023-10-14 ENCOUNTER — Ambulatory Visit (HOSPITAL_COMMUNITY)
Admission: RE | Admit: 2023-10-14 | Discharge: 2023-10-14 | Disposition: A | Discharge status: Home / Self Care | Source: Ambulatory Visit | Attending: Cardiology | Admitting: Cardiology

## 2023-10-14 DIAGNOSIS — I10 Essential (primary) hypertension: Secondary | ICD-10-CM | POA: Diagnosis not present

## 2023-10-14 DIAGNOSIS — E7800 Pure hypercholesterolemia, unspecified: Secondary | ICD-10-CM

## 2023-10-14 DIAGNOSIS — I25118 Atherosclerotic heart disease of native coronary artery with other forms of angina pectoris: Secondary | ICD-10-CM | POA: Diagnosis not present

## 2023-10-14 LAB — MYOCARDIAL PERFUSION IMAGING
Angina Index: 0
Duke Treadmill Score: 6
Estimated workload: 13.4
Exercise duration (min): 11 min
Exercise duration (sec): 0 s
LV dias vol: 100 mL (ref 62–150)
LV sys vol: 38 mL (ref 4.2–5.8)
MPHR: 157 {beats}/min
Nuc Stress EF: 62 %
Peak HR: 146 {beats}/min
Percent HR: 92 %
Rest HR: 59 {beats}/min
Rest Nuclear Isotope Dose: 10.5 mCi
SDS: 0
SRS: 5
SSS: 3
ST Depression (mm): 1 mm
Stress Nuclear Isotope Dose: 32.6 mCi
TID: 0.97

## 2023-10-14 MED ORDER — TECHNETIUM TC 99M TETROFOSMIN IV KIT
10.5000 | PACK | Freq: Once | INTRAVENOUS | Status: AC | PRN
  Administered 2023-10-14 (×2): 10.5 via INTRAVENOUS

## 2023-10-14 MED ORDER — TECHNETIUM TC 99M TETROFOSMIN IV KIT
32.6000 | PACK | Freq: Once | INTRAVENOUS | Status: AC | PRN
  Administered 2023-10-14 (×2): 32.6 via INTRAVENOUS

## 2023-10-15 ENCOUNTER — Ambulatory Visit: Payer: Self-pay | Admitting: Cardiology

## 2023-10-21 ENCOUNTER — Ambulatory Visit: Payer: Self-pay | Admitting: Cardiology

## 2023-11-09 DIAGNOSIS — R051 Acute cough: Secondary | ICD-10-CM | POA: Diagnosis not present

## 2023-11-09 DIAGNOSIS — I10 Essential (primary) hypertension: Secondary | ICD-10-CM | POA: Diagnosis not present

## 2023-11-09 DIAGNOSIS — J069 Acute upper respiratory infection, unspecified: Secondary | ICD-10-CM | POA: Diagnosis not present

## 2024-01-25 ENCOUNTER — Emergency Department (HOSPITAL_COMMUNITY)
Admission: EM | Admit: 2024-01-25 | Discharge: 2024-01-25 | Disposition: A | Discharge status: Home / Self Care | Attending: Emergency Medicine | Admitting: Emergency Medicine

## 2024-01-25 ENCOUNTER — Encounter (HOSPITAL_COMMUNITY): Payer: Self-pay

## 2024-01-25 ENCOUNTER — Emergency Department (HOSPITAL_COMMUNITY)

## 2024-01-25 ENCOUNTER — Other Ambulatory Visit: Payer: Self-pay

## 2024-01-25 DIAGNOSIS — S51811A Laceration without foreign body of right forearm, initial encounter: Secondary | ICD-10-CM | POA: Insufficient documentation

## 2024-01-25 DIAGNOSIS — S59911A Unspecified injury of right forearm, initial encounter: Secondary | ICD-10-CM | POA: Diagnosis not present

## 2024-01-25 DIAGNOSIS — W293XXA Contact with powered garden and outdoor hand tools and machinery, initial encounter: Secondary | ICD-10-CM | POA: Insufficient documentation

## 2024-01-25 DIAGNOSIS — Z7982 Long term (current) use of aspirin: Secondary | ICD-10-CM | POA: Insufficient documentation

## 2024-01-25 DIAGNOSIS — Z79899 Other long term (current) drug therapy: Secondary | ICD-10-CM | POA: Diagnosis not present

## 2024-01-25 MED ORDER — LIDOCAINE-EPINEPHRINE (PF) 2 %-1:200000 IJ SOLN
10.0000 mL | Freq: Once | INTRAMUSCULAR | Status: AC
  Administered 2024-01-25: 10 mL
  Filled 2024-01-25: qty 20

## 2024-01-25 NOTE — Discharge Instructions (Addendum)
 For signs of infection which include redness, swelling, purulent drainage, worsening pain.  Have the wound reassessed in 7 to 10 days time for suture removal.  This can be done with your PCP, urgent care or in the emergency department.  Clean the wound daily with warm soapy water, can dressed with gauze, can apply bacitracin ointment as well.

## 2024-01-25 NOTE — ED Provider Notes (Signed)
 [place] EMERGENCY DEPARTMENT AT [facility] Provider Note   CSN: [id number] Arrival date & time: 01/25/24  1119     Patient presents with: Extremity Laceration   Darrell Coleman is a 63 y.o. male.   HPI   63 year old male presenting to the emergency department with a laceration to the right forearm.  The patient states that he was cutting bushes with a chainsaw and the chainsaw struck his arm.  He sustained a 5 cm laceration to the right forearm, bleeding is controlled.  He sustained also multiple smaller abrasions to the right forearm.  He is not on anticoagulation.  He denies any other injuries or complaints.  Prior to Admission medications   Medication Sig Start Date End Date Taking? Authorizing Provider  allopurinol (ZYLOPRIM) 100 MG tablet Take 100 mg by mouth daily.    [provider]  aspirin  325 MG tablet Take 81 mg by mouth daily.     [provider]  atorvastatin  (LIPITOR) 10 MG tablet Take 1 tablet (10 mg total) by mouth daily. <PLEASE SCHEDULE APPOINTMENT FOR REFILLS> 10/10/13   Jordan, Peter M, MD  lisinopril  (ZESTRIL ) 10 MG tablet Take 10 mg by mouth daily.    [provider]  Multiple Vitamin (MULTI-VITAMIN PO) Take by mouth daily.    [provider]  nitroGLYCERIN  (NITROSTAT ) 0.4 MG SL tablet Place 1 tablet (0.4 mg total) under the tongue every 5 (five) minutes as needed. For chest pain 10/16/16   Meng, Hao, PA  Omega-3 Fatty Acids (FISH OIL) 1200 MG CAPS Take 1,200 mg by mouth 2 (two) times daily.     [provider]  tadalafil  (CIALIS ) 5 MG tablet Take 1-4 tablets (5-20 mg total) by mouth daily as needed for erectile dysfunction (take 45 minutes before sexual activity). 08/20/22   Francisca Redell BROCKS, MD    Allergies: Penicillins    Review of Systems  All other systems reviewed and are negative.   Updated Vital Signs BP 116/78   Pulse 66   Temp 97.9 F (36.6 C) (Oral)   Resp 16   Ht 6' 2 (1.88 m)   Wt  102.1 kg   SpO2 100%   BMI 28.89 kg/m   Physical Exam Vitals and nursing note reviewed.  Constitutional:      General: He is not in acute distress. HENT:     Head: Normocephalic and atraumatic.  Eyes:     Conjunctiva/sclera: Conjunctivae normal.     Pupils: Pupils are equal, round, and reactive to light.  Cardiovascular:     Rate and Rhythm: Normal rate and regular rhythm.  Pulmonary:     Effort: Pulmonary effort is normal. No respiratory distress.  Abdominal:     General: There is no distension.     Tenderness: There is no guarding.  Musculoskeletal:        General: No deformity or signs of injury.     Cervical back: Neck supple.     Comments: 5 cm laceration to the right forearm, no evidence of tendon involvement, right arm neurovascular intact with 2+ radial pulses, intact motor function of the median, ulnar, radial nerve distributions.  Wound is hemostatic, multiple smaller linear abrasions are also present and hemostatic.  Skin:    Findings: No lesion or rash.  Neurological:     General: No focal deficit present.     Mental Status: He is alert. Mental status is at baseline.     (all labs ordered are listed, but  only abnormal results are displayed) Labs Reviewed - No data to display  EKG: None  Radiology: DG Forearm Right Result Date: 01/25/2024 EXAM: 2 VIEW(S) XRAY OF THE UNSPECIFIED FOREARM 01/25/2024 12:16:00 PM COMPARISON: None available. CLINICAL HISTORY: chainsaw injury FINDINGS: FINDINGS: BONES AND JOINTS: No acute fracture. No focal osseous lesion. No joint dislocation. SOFT TISSUES: Soft tissue swelling present with soft tissue changes of patient's known laceration. No radiopaque foreign body. IMPRESSION: 1. No acute fracture or dislocation. 2. No radiopaque foreign body. Electronically signed by: Rogelia Myers MD 01/25/2024 12:56 PM EST RP Workstation: [id number]     .Laceration Repair  Date/Time: 01/25/2024 1:31 PM  Performed by: Jerrol Agent,  MD Authorized by: Jerrol Agent, MD   Consent:    Consent obtained:  Verbal   Consent given by:  Patient   Risks discussed:  Infection, pain, poor cosmetic result and poor wound healing Universal protocol:    Patient identity confirmed:  Verbally with patient Anesthesia:    Anesthesia method:  Local infiltration   Local anesthetic:  Lidocaine  1% WITH epi Laceration details:    Location:  Shoulder/arm   Shoulder/arm location:  R lower arm   Length (cm):  5 Exploration:    Wound extent: fascia not violated, no signs of injury and no tendon damage   Treatment:    Area cleansed with:  Povidone-iodine and Shur-Clens   Amount of cleaning:  Standard Skin repair:    Repair method:  Sutures   Suture size:  3-0   Suture material:  Nylon   Suture technique:  Simple interrupted   Number of sutures:  5 Approximation:    Approximation:  Close Repair type:    Repair type:  Simple Post-procedure details:    Dressing:  Bulky dressing   Procedure completion:  Tolerated    Medications Ordered in the ED  lidocaine -EPINEPHrine  (XYLOCAINE  W/EPI) 2 %-1:200000 (PF) injection 10 mL (10 mLs Infiltration Given 01/25/24 1202)                                    Medical Decision Making Amount and/or Complexity of Data Reviewed Radiology: ordered.  Risk Prescription drug management.    63 year old male presenting to the emergency department with a laceration to the right forearm.  The patient states that he was cutting bushes with a chainsaw and the chainsaw struck his arm.  He sustained a 5 cm laceration to the right forearm, bleeding is controlled.  He sustained also multiple smaller abrasions to the right forearm.  He is not on anticoagulation.  He denies any other injuries or complaints.  On arrival, the patient was afebrile, not tachycardic or tachypneic, hemodynamically stable.  On exam the patient was found to have a 5 cm laceration to the right forearm, no evidence of tendon  involvement, right arm neurovascular intact with 2+ radial pulses, intact motor function of the median, ulnar, radial nerve distributions.  Wound is hemostatic, multiple smaller linear abrasions are also present and hemostatic.  X-ray imaging was obtained which revealed no evidence of acute fracture or dislocation with no radiopaque foreign body.  The wound was cleaned bedside by myself with copious irrigation.  He was anesthetized with lidocaine  and the repaired per the procedure note above with five 3-0 nylon sutures.  Patient was righted with wound care precautions.  His tetanus is up-to-date.  He was advised continued outpatient wound management, outpatient follow-up for wound reassessment and  suture removal.  Wound was dressed by nursing prior to discharge.     Final diagnoses:  Laceration of right forearm, initial encounter  Contact with chainsaw as cause of accidental injury    ED Discharge Orders     None          Jerrol Agent, MD 01/25/24 [phone]

## 2024-01-25 NOTE — ED Triage Notes (Signed)
 Pt from home cutting bushes and chainsaw fell on pts right forearm. Pt has 3-4 inches laceration. Bleeding controlled. Axox4. VSS. Denies blood thinners.

## 2024-01-25 NOTE — ED Notes (Signed)
 RN cleaned and dressed laceration

## 2024-02-03 DIAGNOSIS — I10 Essential (primary) hypertension: Secondary | ICD-10-CM | POA: Diagnosis not present
